# Patient Record
Sex: Male | Born: 1950 | Hispanic: No | Marital: Married | Smoking: Never smoker
Health system: Southern US, Community
[De-identification: ages and names within clinical notes are randomized; demographics above are authoritative.]

## PROBLEM LIST (undated history)

## (undated) DIAGNOSIS — I10 Essential (primary) hypertension: Secondary | ICD-10-CM

## (undated) DIAGNOSIS — I2510 Atherosclerotic heart disease of native coronary artery without angina pectoris: Secondary | ICD-10-CM

## (undated) DIAGNOSIS — M549 Dorsalgia, unspecified: Secondary | ICD-10-CM

## (undated) DIAGNOSIS — F32A Depression, unspecified: Secondary | ICD-10-CM

## (undated) DIAGNOSIS — I219 Acute myocardial infarction, unspecified: Secondary | ICD-10-CM

## (undated) DIAGNOSIS — I639 Cerebral infarction, unspecified: Secondary | ICD-10-CM

## (undated) DIAGNOSIS — E119 Type 2 diabetes mellitus without complications: Secondary | ICD-10-CM

## (undated) DIAGNOSIS — F329 Major depressive disorder, single episode, unspecified: Secondary | ICD-10-CM

## (undated) HISTORY — DX: Cerebral infarction, unspecified: I63.9

## (undated) HISTORY — DX: Type 2 diabetes mellitus without complications: E11.9

---

## 2004-03-13 HISTORY — PX: CORONARY ARTERY BYPASS GRAFT: SHX141

## 2011-04-03 ENCOUNTER — Encounter (HOSPITAL_COMMUNITY): Payer: Self-pay | Admitting: *Deleted

## 2011-04-03 ENCOUNTER — Inpatient Hospital Stay (HOSPITAL_COMMUNITY)
Admission: EM | Admit: 2011-04-03 | Discharge: 2011-04-06 | DRG: 014 | Disposition: A | Discharge status: Rehabilitation Facility | Payer: BC Managed Care – PPO | Attending: Internal Medicine | Admitting: Internal Medicine

## 2011-04-03 ENCOUNTER — Emergency Department (HOSPITAL_COMMUNITY): Payer: BC Managed Care – PPO

## 2011-04-03 ENCOUNTER — Other Ambulatory Visit: Payer: Self-pay

## 2011-04-03 DIAGNOSIS — R4701 Aphasia: Secondary | ICD-10-CM | POA: Diagnosis present

## 2011-04-03 DIAGNOSIS — I6350 Cerebral infarction due to unspecified occlusion or stenosis of unspecified cerebral artery: Principal | ICD-10-CM | POA: Diagnosis present

## 2011-04-03 DIAGNOSIS — F3289 Other specified depressive episodes: Secondary | ICD-10-CM | POA: Diagnosis present

## 2011-04-03 DIAGNOSIS — I252 Old myocardial infarction: Secondary | ICD-10-CM

## 2011-04-03 DIAGNOSIS — R1310 Dysphagia, unspecified: Secondary | ICD-10-CM | POA: Diagnosis present

## 2011-04-03 DIAGNOSIS — E785 Hyperlipidemia, unspecified: Secondary | ICD-10-CM

## 2011-04-03 DIAGNOSIS — I639 Cerebral infarction, unspecified: Secondary | ICD-10-CM | POA: Diagnosis present

## 2011-04-03 DIAGNOSIS — Z951 Presence of aortocoronary bypass graft: Secondary | ICD-10-CM

## 2011-04-03 DIAGNOSIS — G8190 Hemiplegia, unspecified affecting unspecified side: Secondary | ICD-10-CM | POA: Diagnosis present

## 2011-04-03 DIAGNOSIS — F329 Major depressive disorder, single episode, unspecified: Secondary | ICD-10-CM | POA: Diagnosis not present

## 2011-04-03 DIAGNOSIS — I2510 Atherosclerotic heart disease of native coronary artery without angina pectoris: Secondary | ICD-10-CM | POA: Diagnosis present

## 2011-04-03 DIAGNOSIS — F32A Depression, unspecified: Secondary | ICD-10-CM | POA: Diagnosis not present

## 2011-04-03 DIAGNOSIS — I10 Essential (primary) hypertension: Secondary | ICD-10-CM | POA: Diagnosis present

## 2011-04-03 HISTORY — DX: Dorsalgia, unspecified: M54.9

## 2011-04-03 HISTORY — DX: Major depressive disorder, single episode, unspecified: F32.9

## 2011-04-03 HISTORY — DX: Essential (primary) hypertension: I10

## 2011-04-03 HISTORY — DX: Acute myocardial infarction, unspecified: I21.9

## 2011-04-03 HISTORY — DX: Atherosclerotic heart disease of native coronary artery without angina pectoris: I25.10

## 2011-04-03 HISTORY — DX: Depression, unspecified: F32.A

## 2011-04-03 LAB — CBC
HCT: 43.4 % (ref 39.0–52.0)
Hemoglobin: 14.3 g/dL (ref 13.0–17.0)
MCH: 19.6 pg — ABNORMAL LOW (ref 26.0–34.0)
MCHC: 32.9 g/dL (ref 30.0–36.0)
MCV: 59.5 fL — ABNORMAL LOW (ref 78.0–100.0)

## 2011-04-03 LAB — COMPREHENSIVE METABOLIC PANEL
Albumin: 3.9 g/dL (ref 3.5–5.2)
BUN: 16 mg/dL (ref 6–23)
Chloride: 101 mEq/L (ref 96–112)
Creatinine, Ser: 0.92 mg/dL (ref 0.50–1.35)
GFR calc Af Amer: >90 mL/min (ref >90)
Glucose, Bld: 167 mg/dL — ABNORMAL HIGH (ref 70–99)
Total Bilirubin: 0.6 mg/dL (ref 0.3–1.2)
Total Protein: 8.2 g/dL (ref 6.0–8.3)

## 2011-04-03 LAB — DIFFERENTIAL
Basophils Relative: 0 % (ref 0–1)
Eosinophils Absolute: 0 10*3/uL (ref 0.0–0.7)
Monocytes Absolute: 0.7 10*3/uL (ref 0.1–1.0)
Monocytes Relative: 8 % (ref 3–12)

## 2011-04-03 LAB — CK TOTAL AND CKMB (NOT AT ARMC)
CK, MB: 1.7 ng/mL (ref 0.3–4.0)
Total CK: 324 U/L — ABNORMAL HIGH (ref 7–232)

## 2011-04-03 LAB — RAPID URINE DRUG SCREEN, HOSP PERFORMED
Opiates: NOT DETECTED
Tetrahydrocannabinol: NOT DETECTED

## 2011-04-03 MED ORDER — SODIUM CHLORIDE 0.9 % IJ SOLN: 3.0000 mL | INTRAMUSCULAR | Status: DC | PRN

## 2011-04-03 MED ORDER — ASPIRIN 325 MG PO TABS
325.0000 mg | ORAL_TABLET | Freq: Every day | ORAL | Status: DC
  Administered 2011-04-04 – 2011-04-06 (×3): 325 mg via ORAL
  Filled 2011-04-03 (×3): qty 1

## 2011-04-03 MED ORDER — ENOXAPARIN SODIUM 40 MG/0.4ML ~~LOC~~ SOLN
40.0000 mg | SUBCUTANEOUS | Status: DC
  Administered 2011-04-04 – 2011-04-06 (×3): 40 mg via SUBCUTANEOUS
  Filled 2011-04-03 (×3): qty 0.4

## 2011-04-03 MED ORDER — SODIUM CHLORIDE 0.9 % IV SOLN: 250.0000 mL | INTRAVENOUS | Status: DC | PRN

## 2011-04-03 MED ORDER — SODIUM CHLORIDE 0.9 % IJ SOLN
3.0000 mL | Freq: Two times a day (BID) | INTRAMUSCULAR | Status: DC
  Administered 2011-04-04 – 2011-04-06 (×2): 3 mL via INTRAVENOUS

## 2011-04-03 MED ORDER — INFLUENZA VIRUS VACC SPLIT PF IM SUSP
0.5000 mL | INTRAMUSCULAR | Status: AC
  Administered 2011-04-04: 0.5 mL via INTRAMUSCULAR
  Filled 2011-04-03: qty 0.5

## 2011-04-03 MED ORDER — ROSUVASTATIN CALCIUM 40 MG PO TABS
40.0000 mg | ORAL_TABLET | Freq: Every day | ORAL | Status: DC
  Administered 2011-04-04 – 2011-04-05 (×2): 40 mg via ORAL
  Filled 2011-04-03 (×3): qty 1

## 2011-04-03 MED ORDER — ENOXAPARIN SODIUM 40 MG/0.4ML ~~LOC~~ SOLN: 40.0000 mg | SUBCUTANEOUS | Status: DC

## 2011-04-03 MED ORDER — SENNOSIDES-DOCUSATE SODIUM 8.6-50 MG PO TABS: 1.0000 | ORAL_TABLET | Freq: Every evening | ORAL | Status: DC | PRN

## 2011-04-03 MED ORDER — ASPIRIN 300 MG RE SUPP
300.0000 mg | Freq: Every day | RECTAL | Status: DC
  Filled 2011-04-03: qty 1

## 2011-04-03 MED ORDER — SODIUM CHLORIDE 0.9 % IJ SOLN
3.0000 mL | Freq: Two times a day (BID) | INTRAMUSCULAR | Status: DC
  Administered 2011-04-03 – 2011-04-06 (×5): 3 mL via INTRAVENOUS

## 2011-04-03 MED ORDER — CLOPIDOGREL BISULFATE 300 MG PO TABS
300.0000 mg | ORAL_TABLET | Freq: Once | ORAL | Status: AC
  Administered 2011-04-03: 300 mg via ORAL

## 2011-04-03 MED ORDER — SODIUM CHLORIDE 0.9 % IV SOLN
INTRAVENOUS | Status: DC
  Administered 2011-04-03 – 2011-04-05 (×3): via INTRAVENOUS

## 2011-04-03 NOTE — ED Notes (Signed)
Pt. Arrived in CT scan,

## 2011-04-03 NOTE — ED Notes (Signed)
Pt.  patient arrived  From home via EMS with c/o right arm weakness, pt. Alert and oriented, language barrier noted, pt. speeks Ti with limited broken English, EMS unclear as to last known normal time

## 2011-04-03 NOTE — ED Provider Notes (Signed)
History     CSN: [id number]  Arrival date & time 04/03/11  1927   First MD Initiated Contact with Patient 04/03/11 1930      Chief Complaint  Patient presents with  . Code Stroke    (Consider location/radiation/quality/duration/timing/severity/associated sxs/prior treatment) Patient is a 61 y.o. male presenting with neurologic complaint. The history is provided by the patient. The history is limited by a language barrier. A language interpreter was used.  Neurologic Problem The primary symptoms include focal weakness and speech change. Primary symptoms do not include fever, nausea or vomiting. Episode onset: 5am, 14.5 hrs PTA. The neurological symptoms are focal. The symptoms occurred while sleeping.  There is decreased muscle function with maximum physical effort.  Region/motion of weakness: Right arm, right leg. There is impairment of the following actions: articulating words and standing up.  Features of the speech change include inability to articulate.    No past medical history on file.  No past surgical history on file.  No family history on file.  History  Substance Use Topics  . Smoking status: Not on file  . Smokeless tobacco: Not on file  . Alcohol Use: Not on file      Review of Systems  Constitutional: Negative for fever.  HENT: Negative for trouble swallowing.   Respiratory: Negative for cough and shortness of breath.   Cardiovascular: Negative for chest pain.  Gastrointestinal: Negative for nausea, vomiting, abdominal pain and diarrhea.  Genitourinary: Negative for difficulty urinating.  Neurological: Positive for speech change and focal weakness.  All other systems reviewed and are negative.    Allergies  Review of patient's allergies indicates not on file.  Home Medications  No current outpatient prescriptions on file.  There were no vitals taken for this visit.  Physical Exam  Nursing note and vitals reviewed. Constitutional: He is  oriented to person, place, and time. He appears well-developed and well-nourished. No distress.  HENT:  Head: Normocephalic and atraumatic.  Mouth/Throat: Oropharynx is clear and moist.  Eyes: Conjunctivae are normal. Pupils are equal, round, and reactive to light. No scleral icterus.  Neck: Normal range of motion. Neck supple.  Cardiovascular: Normal rate, regular rhythm, normal heart sounds and intact distal pulses.   No murmur heard. Pulmonary/Chest: Effort normal and breath sounds normal. No stridor. No respiratory distress. He has no wheezes. He has no rales.  Abdominal: Soft. He exhibits no distension. There is no tenderness.  Musculoskeletal: Normal range of motion. He exhibits no edema.  Neurological: He is alert and oriented to person, place, and time. A sensory deficit (right upper extremity) is present. No cranial nerve deficit. Coordination (Unable to perform on RUE and RLE) abnormal.       3/5 strength in RUE and RLE.    Skin: Skin is warm and dry. No rash noted.  Psychiatric: He has a normal mood and affect. His behavior is normal.    ED Course  Procedures (including critical care time)  Labs Reviewed  APTT - Abnormal; Notable for the following:    aPTT 38 (*)    All other components within normal limits  CBC - Abnormal; Notable for the following:    RBC 7.30 (*)    MCV 59.5 (*)    MCH 19.6 (*)    RDW 19.2 (*)    All other components within normal limits  DIFFERENTIAL - Abnormal; Notable for the following:    Neutrophils Relative 78 (*)    All other components within normal limits  COMPREHENSIVE METABOLIC PANEL - Abnormal; Notable for the following:    Glucose, Bld 167 (*)    GFR calc non Af Amer 90 (*)    All other components within normal limits  CK TOTAL AND CKMB - Abnormal; Notable for the following:    Total CK 324 (*)    All other components within normal limits  PROTIME-INR  TROPONIN I  URINE RAPID DRUG SCREEN (HOSP PERFORMED)  TSH  CARDIAC PANEL(CRET  KIN+CKTOT+MB+TROPI)  CARDIAC PANEL(CRET KIN+CKTOT+MB+TROPI)  HEMOGLOBIN A1C  HEMOGLOBIN A1C  LIPID PANEL  CBC  CREATININE, SERUM  CARDIAC PANEL(CRET KIN+CKTOT+MB+TROPI)   Ct Head Wo Contrast  04/03/2011  *RADIOLOGY REPORT*  Clinical Data: Right side weakness.  CT HEAD WITHOUT CONTRAST  Technique:  Contiguous axial images were obtained from the base of the skull through the vertex without contrast.  Comparison: None.  Findings: There is no evidence of acute intracranial abnormality including acute infarction, hemorrhage, mass lesion, mass effect, midline shift or abnormal excessive fluid collection.  No hydrocephalus or pneumocephalus.  Mild chronic microvascular ischemic change noted.  Calvarium intact.  IMPRESSION: No acute finding.  Original Report Authenticated By: Bernadene Bell. D'ALESSIO, M.D.     1. CVA (cerebral vascular accident)       MDM  61 yo male with onset of right sided weakness and mild aphasia 14 hours prior to arrival.  History obtained using interpretor phone.  In bed all day, unable to get up.  Presented as code stroke.  However, this was cancelled after he was determined to be outside of a therapeutic window.  On arrival, ABCs intact.  AFVSS. Had moderate RUE and RLE weakness.  Also appeared to have mild difficulty expressing words, although this exam was limited secondary to language barrier.  CT showed NAICA.  Labs unremarkable.  Neurology recommended Plavix load and hospitalist admission for further workup.  Admitted to hospitalist.          Warnell Forester, MD 04/04/11 [phone]

## 2011-04-03 NOTE — ED Notes (Signed)
Dr. Freida Busman at the bedside, pt. Cleared for ct scan

## 2011-04-03 NOTE — ED Notes (Signed)
PER PT via translator phone:  Pt went to bed last night at 10pm.  Was unable to get out of bed all morning due to weakness.  States that at 5pm, he rolled off the bed and was able to get up with family.  EMS was called by family.  Code stroke called by EMS at 1905, encoded at 46.  LSN per EMS was 1700.  Pt arrived at [facility], EDP exam at 1920.  Neruologist at bedside at 1941, lab at bedside at 1941.  Pt arrived in CT at 1922.  Pt denies pain.  Pt is unable to touch (R) finger to nose, pt also unable to resist gravity with (R) leg.  (L) side is normal, pt denies difficulty with swallowing.  No distress noted, pt resting.  Family at bedside at this time.

## 2011-04-03 NOTE — H&P (Addendum)
PCP:  Nonnie Done., MD, MD   DOA:  04/03/2011  7:27 PM  Chief Complaint:  Acute right sided weakness since this morning   History mainly provided by patient's wife.  61 y/o [place] male with hx of CAD s/p CABG at [facility] in 06, HTN, HL, depression with  not on any medications was brought in by EMS after he was unable to move his right side of the body. Patient was in his usual state of health. Code stroke was called in the ED and since he had crossed the therapeutic window no tPA was given. Patient deneis any chest pain palpitations, SOB, headache, blurring of vision, cough, nausea or vomiting. Denies tinnitus or peripheral numbness. Denies speech or swallowing difficulty. Patient quite restless in bed being unable to move his right side. As per wife patient has not seen a physical in few years as he does not with o see any. Also does not take any mediations and also did not wish to come to the hospital today. He was quite depressed about 4 years back and jumped out of the window of his second story house and broke his ankle and injured his back. Wife feels he is still depressed but denies being suicidal or further attempting such acts after that incident.  Patient was seen by neurology in ED and given a loading dose of plavix and planned on admitting to [facility] service.  Allergies: No Known Allergies  Prior to Admission medications   Not on File    Past Medical History  Diagnosis Date  . Hypertension   . Acute MI   . Coronary artery disease   . Depression   . Back pain     Past Surgical History  Procedure Date  . Coronary artery bypass graft 2006    Social History:  does not have a smoking history on file. He does not have any smokeless tobacco history on file. His alcohol and drug histories not on file.  History reviewed. No pertinent family history.  Review of Systems:  Constitutional: Denies fever, chills, diaphoresis, appetite change and fatigue.  HEENT: Denies  photophobia, eye pain, redness, hearing loss, ear pain, congestion, sore throat, rhinorrhea, sneezing, mouth sores, trouble swallowing, neck pain, neck stiffness and tinnitus.   Respiratory: Denies SOB, DOE, cough, chest tightness,  and wheezing.   Cardiovascular: Denies chest pain, palpitations and leg swelling.  Gastrointestinal: Denies nausea, vomiting, abdominal pain, diarrhea, constipation, blood in stool and abdominal distention.  Genitourinary: Denies dysuria, urgency, frequency, hematuria, flank pain and difficulty urinating.  Musculoskeletal: Denies myalgias, back pain, joint swelling, arthralgias and gait problem.  Skin: Denies pallor, rash and wound.  Neurological: rt Upper and lower extremity weakness, Denies dizziness, seizures, syncope,light-headedness, numbness and headaches.  Hematological: Denies adenopathy. Easy bruising, personal or family bleeding history  Psychiatric/Behavioral: Denies suicidal ideation, mood changes, confusion, nervousness, sleep disturbance and agitation   Physical Exam:  Filed Vitals:   04/03/11 1950 04/03/11 2000 04/03/11 2005 04/03/11 2019  BP: 181/90 154/90 154/90   Pulse: 105 96 91   Temp:  98.4 F (36.9 C)  98.4 F (36.9 C)  TempSrc:  Oral    Resp: 25 22 18    SpO2: 96% 95% 95%     Constitutional: Vital signs reviewed.  Patient is a well-developed and well-nourished in no acute distress and cooperative with exam.  HEENT: no pallor, moist oral mucosa, no JVD,  Cardiovascular: RRR, S1 normal, S2 normal, no MRG, pulses symmetric and intact bilaterally Pulmonary/Chest:  CTAB, no wheezes, rales, or rhonchi Abdominal: Soft. Non-tender, non-distended, bowel sounds are normal, no masses, organomegaly, or guarding present.  GU: no CVA tenderness Musculoskeletal: No joint deformities, erythema, or stiffness, ROM full and no nontender Ext: no edema and no cyanosis, pulses palpable bilaterally (DP and PT) Hematology: no cervical, inginal, or axillary  adenopathy.  Neurological: A&O x3, left facial deviation, normal tone, 3+/5 power in both rt upper and lowe extremities, reflexes 2+ b/l, plantar downgoing b/l ,normal sensations b/l , cerebellar fn intact ( limited evaluation due to rt sided weakness). Gait not assessed. Skin: Warm, dry and intact. No rash, cyanosis, or clubbing.  Psychiatric: Normal mood and affect. speech and behavior is normal. Judgment and thought content normal. Cognition and memory are normal.   Labs on Admission:  Results for orders placed during the hospital encounter of 04/03/11 (from the past 48 hour(s))  PROTIME-INR     Status: Normal   Collection Time   04/03/11  7:52 PM      Component Value Range Comment   Prothrombin Time 13.1  11.6 - 15.2 (seconds)    INR 0.97  0.00 - 1.49    APTT     Status: Abnormal   Collection Time   04/03/11  7:52 PM      Component Value Range Comment   aPTT 38 (*) 24 - 37 (seconds)   CBC     Status: Abnormal   Collection Time   04/03/11  7:52 PM      Component Value Range Comment   WBC 9.4  4.0 - 10.5 (K/uL)    RBC 7.30 (*) 4.22 - 5.81 (MIL/uL)    Hemoglobin 14.3  13.0 - 17.0 (g/dL)    HCT 44.0  10.2 - 72.5 (%)    MCV 59.5 (*) 78.0 - 100.0 (fL)    MCH 19.6 (*) 26.0 - 34.0 (pg)    MCHC 32.9  30.0 - 36.0 (g/dL)    RDW 36.6 (*) 44.0 - 15.5 (%)    Platelets 248  150 - 400 (K/uL)   DIFFERENTIAL     Status: Abnormal   Collection Time   04/03/11  7:52 PM      Component Value Range Comment   Neutrophils Relative 78 (*) 43 - 77 (%)    Neutro Abs 7.3  1.7 - 7.7 (K/uL)    Lymphocytes Relative 14  12 - 46 (%)    Lymphs Abs 1.3  0.7 - 4.0 (K/uL)    Monocytes Relative 8  3 - 12 (%)    Monocytes Absolute 0.7  0.1 - 1.0 (K/uL)    Eosinophils Relative 0  0 - 5 (%)    Eosinophils Absolute 0.0  0.0 - 0.7 (K/uL)    Basophils Relative 0  0 - 1 (%)    Basophils Absolute 0.0  0.0 - 0.1 (K/uL)   COMPREHENSIVE METABOLIC PANEL     Status: Abnormal   Collection Time   04/03/11  7:52 PM       Component Value Range Comment   Sodium 138  135 - 145 (mEq/L)    Potassium 3.6  3.5 - 5.1 (mEq/L)    Chloride 101  96 - 112 (mEq/L)    CO2 23  19 - 32 (mEq/L)    Glucose, Bld 167 (*) 70 - 99 (mg/dL)    BUN 16  6 - 23 (mg/dL)    Creatinine, Ser 3.47  0.50 - 1.35 (mg/dL)    Calcium 9.2  8.4 - 10.5 (  mg/dL)    Total Protein 8.2  6.0 - 8.3 (g/dL)    Albumin 3.9  3.5 - 5.2 (g/dL)    AST 22  0 - 37 (U/L)    ALT 17  0 - 53 (U/L)    Alkaline Phosphatase 83  39 - 117 (U/L)    Total Bilirubin 0.6  0.3 - 1.2 (mg/dL)    GFR calc non Af Amer 90 (*) >90 (mL/min)    GFR calc Af Amer >90  >90 (mL/min)   CK TOTAL AND CKMB     Status: Abnormal   Collection Time   04/03/11  7:52 PM      Component Value Range Comment   Total CK 324 (*) 7 - 232 (U/L)    CK, MB 1.7  0.3 - 4.0 (ng/mL)    Relative Index 0.5  0.0 - 2.5    TROPONIN I     Status: Normal   Collection Time   04/03/11  7:52 PM      Component Value Range Comment   Troponin I <0.30  <0.30 (ng/mL)     Radiological Exams on Admission: CT HEAD WITHOUT CONTRAST  Technique: Contiguous axial images were obtained from the base of  the skull through the vertex without contrast.  Comparison: None.  Findings: There is no evidence of acute intracranial abnormality  including acute infarction, hemorrhage, mass lesion, mass effect,  midline shift or abnormal excessive fluid collection. No  hydrocephalus or pneumocephalus. Mild chronic microvascular  ischemic change noted. Calvarium intact.   IMPRESSION:  No acute finding.   EKG: NSR , no ST-T changes  Assessment 61 y/o [place] male with hx of CAD s/p CABG at [facility] in 06, HTN, HL, depression with not on any medications was brought in by EMS after he was unable to move his right side of the body consistent with rt sided CVA.   Plan   *CVA (cerebrovascular accident) Admit to tele neurochecks  gvien loading dose of plavix in the ED  check serial CE  And EKG Check Hb A1C, TSH  and  lipid panel Allow prrmissive HTN  check 2d echo, carotid doppler, MRI and MRA brain Pt eval, Swallow eval Will follow up with neurology recs     CAD (coronary artery disease) s/p CABG  hx of medication non compliance   Hypertension Stable   allow permissive HTN    Depression Stable   Hyperlipidemia Started on crestor Monitor lipid panel in am  DVT prophylaxis:  sq lovenox  Diet: NPO until cleared for swallow  Full code   Plan discussed with patient and his wife at bedside     Time Spent on Admission: 45 minutes  Jeraldean Wechter 04/03/2011, 8:57 PM

## 2011-04-03 NOTE — ED Notes (Signed)
Pt. Arrived via EMS, Code Stroke called at [employer]

## 2011-04-03 NOTE — Consult Note (Signed)
Reason for Consult:  Stroke code  Referring Physician: Dhungel, N MD  CC: right hemiparesis  HPI: Edward Choi is an 61 y.o. male presenting to the ER with right hemiparesis. The patient does not speak fluent English so the history and physical were conducted with the use of [place] translation via Radio producer. The patient apparently woke up this morning around 5am with right sided weakness.  His wife had already left for work so he lay in bed all day with continued weakness on the right.  Around 5pm, his family was there and he fell out of bed trying to get out.  The family sat him in a chair and then called EMS.  The patient is also having trouble with his speech.  He denies any numbness or tingling. He denies any problems with swallowing or vision.  The patient has a slight headache but denies and dizzyness/vertigo/lightheadedness. He did not have nausea nor did he vomit.  The patient has never had this happen before. He underwent a CABG in 2007 (?) and tells me he has high blood pressure and cholesterol but is not taking any medications at all.    Past Medical History  Diagnosis Date  . Hypertension   . Acute MI   . Coronary artery disease   . Depression   . Back pain     Past Surgical History  Procedure Date  . Coronary artery bypass graft 2006    History reviewed. No pertinent family history.  Social History:  does not have a smoking history on file. He does not have any smokeless tobacco history on file. His alcohol and drug histories not on file.  No Known Allergies  Medications: no home medications  ROS: Pt denies any blood in his bowel movements or urine. He denies fever/chills/SOB/cough/muscle aches. He does not have any chest pain or palpitations.  He does not have diarrhea or pain on urination.  Physical Examination: Blood pressure 143/79, pulse 86, temperature 98.4 F (36.9 C), temperature source Oral, resp. rate 24, SpO2 100.00%. General: pt alert and  oriented, NAD, Limited exam due to language barrier and done via phone translator.   CV: S1,S2, no S3/S4/murmur/carotid bruits Pulm: clear to ausculation BL Abd: soft Neurologic Examination Naming/comprehension not grossly impaired, speech sounds fluent but dysarthric, PERRL, no VF, EOMI, no Fdroop, V1-V3 decreased to ST/PP, hearing grossly intact, pharynx arch slight asym, SCM/trap 5/5, tong slightly deviated to R Motor:  R: UE- dist 2/5, prox 3/5 LE: foot drop, prox 4+/5, L U and LE's 5/5, flaccid tone R UE Sensory: intact to ST/PP Coordination: FNF and HKS WNL on L, dysmetria on R Reflexes: symmetric   Results for orders placed during the hospital encounter of 04/03/11 (from the past 48 hour(s))  PROTIME-INR     Status: Normal   Collection Time   04/03/11  7:52 PM      Component Value Range Comment   Prothrombin Time 13.1  11.6 - 15.2 (seconds)    INR 0.97  0.00 - 1.49    APTT     Status: Abnormal   Collection Time   04/03/11  7:52 PM      Component Value Range Comment   aPTT 38 (*) 24 - 37 (seconds)   CBC     Status: Abnormal   Collection Time   04/03/11  7:52 PM      Component Value Range Comment   WBC 9.4  4.0 - 10.5 (K/uL)    RBC 7.30 (*) 4.22 -  5.81 (MIL/uL)    Hemoglobin 14.3  13.0 - 17.0 (g/dL)    HCT 16.1  09.6 - 04.5 (%)    MCV 59.5 (*) 78.0 - 100.0 (fL)    MCH 19.6 (*) 26.0 - 34.0 (pg)    MCHC 32.9  30.0 - 36.0 (g/dL)    RDW 40.9 (*) 81.1 - 15.5 (%)    Platelets 248  150 - 400 (K/uL)   DIFFERENTIAL     Status: Abnormal   Collection Time   04/03/11  7:52 PM      Component Value Range Comment   Neutrophils Relative 78 (*) 43 - 77 (%)    Neutro Abs 7.3  1.7 - 7.7 (K/uL)    Lymphocytes Relative 14  12 - 46 (%)    Lymphs Abs 1.3  0.7 - 4.0 (K/uL)    Monocytes Relative 8  3 - 12 (%)    Monocytes Absolute 0.7  0.1 - 1.0 (K/uL)    Eosinophils Relative 0  0 - 5 (%)    Eosinophils Absolute 0.0  0.0 - 0.7 (K/uL)    Basophils Relative 0  0 - 1 (%)    Basophils Absolute  0.0  0.0 - 0.1 (K/uL)   COMPREHENSIVE METABOLIC PANEL     Status: Abnormal   Collection Time   04/03/11  7:52 PM      Component Value Range Comment   Sodium 138  135 - 145 (mEq/L)    Potassium 3.6  3.5 - 5.1 (mEq/L)    Chloride 101  96 - 112 (mEq/L)    CO2 23  19 - 32 (mEq/L)    Glucose, Bld 167 (*) 70 - 99 (mg/dL)    BUN 16  6 - 23 (mg/dL)    Creatinine, Ser 9.14  0.50 - 1.35 (mg/dL)    Calcium 9.2  8.4 - 10.5 (mg/dL)    Total Protein 8.2  6.0 - 8.3 (g/dL)    Albumin 3.9  3.5 - 5.2 (g/dL)    AST 22  0 - 37 (U/L)    ALT 17  0 - 53 (U/L)    Alkaline Phosphatase 83  39 - 117 (U/L)    Total Bilirubin 0.6  0.3 - 1.2 (mg/dL)    GFR calc non Af Amer 90 (*) >90 (mL/min)    GFR calc Af Amer >90  >90 (mL/min)   CK TOTAL AND CKMB     Status: Abnormal   Collection Time   04/03/11  7:52 PM      Component Value Range Comment   Total CK 324 (*) 7 - 232 (U/L)    CK, MB 1.7  0.3 - 4.0 (ng/mL)    Relative Index 0.5  0.0 - 2.5    TROPONIN I     Status: Normal   Collection Time   04/03/11  7:52 PM      Component Value Range Comment   Troponin I <0.30  <0.30 (ng/mL)     No results found for this or any previous visit (from the past 240 hour(s)).  Ct Head Wo Contrast  04/03/2011  *RADIOLOGY REPORT*  Clinical Data: Right side weakness.  CT HEAD WITHOUT CONTRAST  Technique:  Contiguous axial images were obtained from the base of the skull through the vertex without contrast.  Comparison: None.  Findings: There is no evidence of acute intracranial abnormality including acute infarction, hemorrhage, mass lesion, mass effect, midline shift or abnormal excessive fluid collection.  No hydrocephalus or pneumocephalus.  Mild  chronic microvascular ischemic change noted.  Calvarium intact.  IMPRESSION: No acute finding.  Original Report Authenticated By: Bernadene Bell. D'ALESSIO, M.D.   Dg Chest Portable 1 View  04/03/2011  *RADIOLOGY REPORT*  Clinical Data: Acute right-sided weakness.  PORTABLE CHEST - 1 VIEW   Comparison: None.  Findings: Heart size and pulmonary vascularity are normal and the lungs are clear.  Markings are slightly accentuated due to a shallow inspiration.  Prior CABG.  IMPRESSION: No acute abnormalities.  Original Report Authenticated By: Gwynn Burly, M.D.     Assessment/Plan:  61 yo [place] man PMH MI s/p CABG, HTN, HLD presenting in the ER with right hemiparesis and dysarthria, not on any medications. PE is localizing. CThead neg for hemorrhage. The patient's last time seen normal is >14 hours.  He is not in the window for tpa or any interventional procedures.   1. Load with plavix 300mg  in the hope of preventing worsening. 2. CVA workup 3. Stroke code called off.    Arita Miss, MD [facility] 04/03/2011, 9:18 PM

## 2011-04-04 ENCOUNTER — Inpatient Hospital Stay (HOSPITAL_COMMUNITY): Payer: BC Managed Care – PPO

## 2011-04-04 DIAGNOSIS — I6330 Cerebral infarction due to thrombosis of unspecified cerebral artery: Secondary | ICD-10-CM

## 2011-04-04 LAB — CBC
Platelets: 220 10*3/uL (ref 150–400)
RBC: 6.49 MIL/uL — ABNORMAL HIGH (ref 4.22–5.81)
RDW: 18.2 % — ABNORMAL HIGH (ref 11.5–15.5)
WBC: 9 10*3/uL (ref 4.0–10.5)

## 2011-04-04 LAB — CARDIAC PANEL(CRET KIN+CKTOT+MB+TROPI)
CK, MB: 1.9 ng/mL (ref 0.3–4.0)
Relative Index: 0.5 (ref 0.0–2.5)
Relative Index: 0.4 (ref 0.0–2.5)
Total CK: 357 U/L — ABNORMAL HIGH (ref 7–232)
Total CK: 512 U/L — ABNORMAL HIGH (ref 7–232)
Total CK: 447 U/L — ABNORMAL HIGH (ref 7–232)

## 2011-04-04 LAB — LIPID PANEL
HDL: 37 mg/dL — ABNORMAL LOW (ref >39)
Total CHOL/HDL Ratio: 6.3 RATIO
VLDL: 22 mg/dL (ref 0–40)

## 2011-04-04 LAB — HEMOGLOBIN A1C: Hgb A1c MFr Bld: 5.6 % (ref <5.7)

## 2011-04-04 LAB — CREATININE, SERUM
Creatinine, Ser: 0.87 mg/dL (ref 0.50–1.35)
GFR calc Af Amer: >90 mL/min (ref >90)

## 2011-04-04 LAB — TSH: TSH: 1.605 u[IU]/mL (ref 0.350–4.500)

## 2011-04-04 NOTE — Progress Notes (Signed)
Carotid Doppler has been performed. Bilaterally no significant ICA stenosis with antegrade vertebral flow.  Farrel Demark, RDMS

## 2011-04-04 NOTE — Progress Notes (Signed)
  Echocardiogram 2D Echocardiogram has been performed.  Juanita Laster Kainon Varady 04/04/2011, 2:28 PM

## 2011-04-04 NOTE — Progress Notes (Signed)
Subjective: Chart reviewed. Patient indicates that he feels stronger and was able to work with physical therapy. He is still weak on the right side and has speech difficulties.  Objective: Blood pressure 170/104, pulse 89, temperature 98.7 F (37.1 C), temperature source Oral, resp. rate 18, height 5\' 2"  (1.575 m), weight 58.2 kg (128 lb 4.9 oz), SpO2 97.00%. No intake or output data in the 24 hours ending 04/04/11 1118  General Exam: Comfortable. Sitting up in a chair and in no obvious distress. Respiratory System: Clear. No increased work of breathing. Cardiovascular System: First and second heart sounds heard. Regular rate and rhythm. No JVD/murmurs. Telemetry shows sinus rhythm. Gastrointestinal System: Abdomen is non distended, soft and normal bowel sounds heard. Central Nervous System: Alert and oriented. Mild right lower facial weakness. Extremities: Grade 0 x 5 power all groups in the right upper extremity. Grade 3 x 5 right lower extremity proximally and 2 x 5 distally.  Lab Results: Basic Metabolic Panel:  Basename 04/03/11 1952 04/03/11  NA 138 --  K 3.6 --  CL 101 --  CO2 23 --  GLUCOSE 167* --  BUN 16 --  CREATININE 0.92 0.87  CALCIUM 9.2 --  MG -- --  PHOS -- --   Liver Function Tests:  [facility] 04/03/11 1952  AST 22  ALT 17  ALKPHOS 83  BILITOT 0.6  PROT 8.2  ALBUMIN 3.9   No results found for this basename: LIPASE:2,AMYLASE:2 in the last 72 hours No results found for this basename: AMMONIA:2 in the last 72 hours CBC:  Basename 04/03/11 1952 04/03/11  WBC 9.4 9.0  NEUTROABS 7.3 --  HGB 14.3 12.8*  HCT 43.4 38.1*  MCV 59.5* 58.7*  PLT 248 220   Cardiac Enzymes:  Basename 04/04/11 0823 04/03/11 1952 04/03/11  CKTOTAL 512* 324* 357*  CKMB 2.3 1.7 1.9  CKMBINDEX -- -- --  TROPONINI <0.30 <0.30 <0.30   BNP: No results found for this basename: PROBNP:3 in the last 72 hours D-Dimer: No results found for this basename: DDIMER:2 in the last 72  hours CBG: No results found for this basename: GLUCAP:6 in the last 72 hours Hemoglobin A1C:  Basename 04/03/11  HGBA1C 5.6   Fasting Lipid Panel:  Basename 04/04/11 0630  CHOL 232*  HDL 37*  LDLCALC 173*  TRIG 111  CHOLHDL 6.3  LDLDIRECT --   Thyroid Function Tests:  Basename 04/03/11  TSH 1.605  T4TOTAL --  FREET4 --  T3FREE --  THYROIDAB --   Anemia Panel: No results found for this basename: VITAMINB12,FOLATE,FERRITIN,TIBC,IRON,RETICCTPCT in the last 72 hours Coagulation:  Basename 04/03/11 1952  LABPROT 13.1  INR 0.97   Urine Drug Screen: Drugs of Abuse     Component Value Date/Time   LABOPIA NONE DETECTED 04/03/2011 2031   COCAINSCRNUR NONE DETECTED 04/03/2011 2031   LABBENZ NONE DETECTED 04/03/2011 2031   AMPHETMU NONE DETECTED 04/03/2011 2031   THCU NONE DETECTED 04/03/2011 2031   LABBARB NONE DETECTED 04/03/2011 2031    Alcohol Level: No results found for this basename: ETH:2 in the last 72 hours  Micro Results: No results found for this or any previous visit (from the past 240 hour(s)).  Studies/Results: Ct Head Wo Contrast  04/03/2011  *RADIOLOGY REPORT*  Clinical Data: Right side weakness.  CT HEAD WITHOUT CONTRAST  Technique:  Contiguous axial images were obtained from the base of the skull through the vertex without contrast.  Comparison: None.  Findings: There is no evidence of acute intracranial abnormality including  acute infarction, hemorrhage, mass lesion, mass effect, midline shift or abnormal excessive fluid collection.  No hydrocephalus or pneumocephalus.  Mild chronic microvascular ischemic change noted.  Calvarium intact.  IMPRESSION: No acute finding.  Original Report Authenticated By: Bernadene Bell. Maricela Curet, M.D.   Mr Maxine Glenn Head Wo Contrast  04/04/2011  *RADIOLOGY REPORT*  Clinical Data:  Right-sided weakness.  MRI HEAD WITHOUT CONTRAST MRA HEAD WITHOUT CONTRAST  Technique: Multiplanar, multiecho pulse sequences of the brain and surrounding  structures were obtained according to standard protocol without intravenous contrast.  Angiographic images of the head were obtained using MRA technique without contrast.  Comparison: 04/03/2011 CT.  MRI HEAD  Findings:  Left paracentral pontine acute non hemorrhagic infarct.  Moderate nonspecific white matter type changes probably related to result of small vessel disease given the present findings.  No intracranial hemorrhage.  Global atrophy without hydrocephalus.  No intracranial mass lesion detected on this unenhanced exam.  Partial opacification left mastoid air cells without obstructing lesion noted.  Mild mucosal thickening ethmoid sinus air cells.  Minimal anterior slip C2 with mild spinal stenosis.  IMPRESSION: Left paracentral pontine acute non hemorrhagic infarct.  Moderate nonspecific white matter type changes probably related to result of small vessel disease.  Global atrophy without hydrocephalus.  Partial opacification left mastoid air cells without obstructing lesion noted.  Mild mucosal thickening ethmoid sinus air cells.  MRA HEAD  Findings: Mild narrowing proximal A1 segment right anterior cerebral artery.  Mild narrowing right middle cerebral artery bifurcation and proximal right middle cerebral artery branch vessels.  Otherwise anterior circulation without medium or large size vessel significant stenosis or occlusion.  Codominant vertebral arteries appear ectatic with mild narrowing of the distal left vertebral artery and mild narrowing of the right vertebral artery at the takeoff of the right PICA.  Irregularity with mild narrowing mid and distal aspect of the basilar artery.  Nonvisualization right AICA with diminutive size of left AICA.  Mild irregularity superior cerebellar arteries bilaterally.  Posterior cerebral artery distal branch vessel irregularity.  No aneurysm noted.  IMPRESSION: Mild intracranial atherosclerotic type changes as noted above.  Original Report Authenticated By:  Fuller Canada, M.D.    Dg Chest Portable 1 View  04/03/2011  *RADIOLOGY REPORT*  Clinical Data: Acute right-sided weakness.  PORTABLE CHEST - 1 VIEW  Comparison: None.  Findings: Heart size and pulmonary vascularity are normal and the lungs are clear.  Markings are slightly accentuated due to a shallow inspiration.  Prior CABG.  IMPRESSION: No acute abnormalities.  Original Report Authenticated By: Gwynn Burly, M.D.   Carotid Doppler Bilaterally no significant ICA stenosis with antegrade vertebral flow    Medications: Scheduled Meds:    . aspirin  325 mg Oral Daily  . clopidogrel  300 mg Oral Once  . enoxaparin  40 mg Subcutaneous Q24H  . influenza  inactive virus vaccine  0.5 mL Intramuscular Tomorrow-1000  . rosuvastatin  40 mg Oral q1800  . sodium chloride  3 mL Intravenous Q12H  . sodium chloride  3 mL Intravenous Q12H  . DISCONTD: aspirin  300 mg Rectal Daily  . DISCONTD: enoxaparin  40 mg Subcutaneous Q24H   Continuous Infusions:    . sodium chloride 100 mL/hr at 04/04/11 0500   PRN Meds:.sodium chloride, sodium chloride, senna-docusate, sodium chloride, sodium chloride  Assessment/Plan: 1. 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 Continue aspirin 325 mg daily for secondary stroke prevention. Plavix stopped. Inpatient rehabilitation consulted and input  is pending. Neurology followup appreciated. Continue statins. 2-D echo is pending. 2. Hypertension: Allow permissive hypertension for a day or 2. May consider starting metoprolol. 3. Hyperlipidemia: Continue Crestor. 4. History of coronary artery disease status post CABG: Continue aspirin 5. Medication noncompliance: Compliance counseled 6. Microcytosis: ? secondary to hemoglobinopathy. Hemoglobin is normal.  Discussed with patient and his spouse at the bedside and updated care.    HONGALGI,ANAND 04/04/2011, 11:18 AM

## 2011-04-04 NOTE — ED Provider Notes (Signed)
I saw and evaluated the patient, reviewed the resident's note and I agree with the findings and plan.  Toy Baker, MD 04/04/11 [phone]

## 2011-04-04 NOTE — Progress Notes (Signed)
Speech Language Pathology Clinical/Bedside Swallow Evaluation Patient Details  Name: Edward Choi MRN: [id number] DOB: 1950-09-15 Today's Date: 04/04/2011  Past Medical History:  Past Medical History  Diagnosis Date  . Hypertension   . Acute MI   . Coronary artery disease   . Depression   . Back pain    Past Surgical History:  Past Surgical History  Procedure Date  . Coronary artery bypass graft 2006   HPI:  61 y/o [place] male with hx of CAD s/p CABG(2006), HTN, HL, depression.  Brought in by EMS after he was unable to move his right side of the body.  MRI revealed Left paracentral pontine acute non hemorrhagic infarct.   Assessment/Recommendations/Treatment Plan   SLP Assessment Clinical Impression Statement: One episode of coughing noted with straw use, likely due to larger bolus size.  No cough response elicited with thin liquid via cup sip.  Pt unable to use right (dominant) hand to cut, so will downgrade diet to soft with chopped meats, extra gravy/sauce to facilitate independence with self-feeding.  Precautions posted at [facility]. Risk for Aspiration: Mild  Swallow Evaluation Recommendations Solid Consistency: Dysphagia 3 (Mechanical soft) (chopped meats, extra gravy/sauce) Liquid Consistency: Thin Liquid Administration via: No straw;Cup Medication Administration: Whole meds with puree Supervision: Patient able to self feed;Intermittent supervision to cue for compensatory strategies Compensations: Slow rate Postural Changes and/or Swallow Maneuvers: Seated upright 90 degrees Oral Care Recommendations: Oral care BID Recommendations for Other Services: Rehab consult Follow up Recommendations: Inpatient Rehab  Treatment Plan Treatment Plan Recommendations: Therapy as outlined in treatment plan below Speech Therapy Frequency: min 2x/week Treatment Duration: 2 weeks Interventions: Aspiration precaution training;Patient/family education;Diet toleration management by  SLP;Trials of upgraded texture/liquids  Prognosis Prognosis for Safe Diet Advancement: Good  Individuals Consulted Consulted and Agree with Results and Recommendations: Patient;Family member/caregiver Family Member Consulted: wife  Swallowing Goals  SLP Swallowing Goals Patient will consume recommended diet without observed clinical signs of aspiration with: Minimal assistance Swallow Study Goal #1 - Progress: Progressing toward goal Patient will utilize recommended strategies during swallow to increase swallowing safety with: Minimal assistance Swallow Study Goal #2 - Progress: Progressing toward goal  Swallow Study Prior Functional Status  Type of Home: House Lives With: Spouse;Family Receives Help From: Family Vocation: Unemployed  General  Date of Onset: 04/03/11 HPI: 61 y/o [place] male with hx of CAD s/p CABG(2006), HTN, HL, depression.  Brought in by EMS after he was unable to move his right side of the body.  MRI revealed Left paracentral pontine acute non hemorrhagic infarct. Type of Study: Bedside swallow evaluation Diet Prior to this Study: Regular;Thin liquids Temperature Spikes Noted: No Respiratory Status: Room air History of Intubation: No Behavior/Cognition: Alert;Cooperative;Pleasant mood Oral Cavity - Dentition: Adequate natural dentition Vision: Functional for self-feeding Patient Positioning: Upright in bed Baseline Vocal Quality: Clear Volitional Cough: Strong Volitional Swallow: Able to elicit  Oral Motor/Sensory Function  Labial ROM: Reduced right Labial Symmetry: Abnormal symmetry right Labial Strength: Reduced Labial Sensation: Within Functional Limits Lingual ROM: Reduced right Lingual Symmetry: Abnormal symmetry right Lingual Strength: Reduced Lingual Sensation: Within Functional Limits Facial ROM: Reduced right Facial Symmetry: Right droop Facial Strength: Reduced Facial Sensation: Within Functional Limits Velum: Within Functional  Limits Mandible: Within Functional Limits  Consistency Results  Ice Chips Ice chips: Not tested  Thin Liquid Thin Liquid: Impaired Presentation: Cup;Self Fed;Straw Pharyngeal  Phase Impairments: Cough - Immediate;Other (comments) (with straw use - pt takes larger boluses via straw)  Nectar Thick Liquid Nectar Thick  Liquid: Not tested  Honey Thick Liquid Honey Thick Liquid: Not tested  Puree Puree: Within functional limits Presentation: Self Fed;Spoon  Solid Solid: Within functional limits Presentation: Self Fed;Spoon Celia B. Murvin Natal [facility], CCC-SLP [phone] pager

## 2011-04-04 NOTE — Evaluation (Signed)
Occupational Therapy Evaluation Patient Details Name: Edward Choi MRN: [id number] DOB: 1950-08-18 Today's Date: 04/04/2011  Problem List:  Patient Active Problem List  Diagnoses  . CAD (coronary artery disease)  . CVA (cerebrovascular accident)  . Hypertension  . Depression  . Hx of CABG  . Hyperlipidemia    Past Medical History:  Past Medical History  Diagnosis Date  . Hypertension   . Acute MI   . Coronary artery disease   . Depression   . Back pain    Past Surgical History:  Past Surgical History  Procedure Date  . Coronary artery bypass graft 2006    OT Assessment/Plan/Recommendation OT Assessment Clinical Impression Statement: 61 yo s/p Patient presents with dominant right side hemiplgia, decreased postural control, delayed balance and righting reactions, mild motor apraxia, blurry vision of new onset, decreasde sustained trunk and right LE muscle activation, and overall decreased activity tolerance resulting in increased risk of fall and decreased functional independence. Acute OT to follow patient at frequencing of 2x/week during acute hospital stay to address these impairments for maximum functional gain. Recommend CIR consult for further therapies in CIR setting prior to discharge home. Patient and wife in agreement with this plan.  OT Recommendation/Assessment: Patient will need skilled OT in the acute care venue OT Problem List: Decreased range of motion;Decreased strength;Decreased activity tolerance;Impaired balance (sitting and/or standing);Decreased safety awareness;Decreased knowledge of use of DME or AE;Decreased knowledge of precautions;Impaired sensation;Impaired UE functional use OT Therapy Diagnosis : Generalized weakness;Hemiplegia dominant side OT Plan OT Frequency: Min 2X/week OT Treatment/Interventions: Self-care/ADL training;Neuromuscular education;DME and/or AE instruction;Therapeutic activities;Patient/family education;Balance  training;Visual/perceptual remediation/compensation OT Recommendation Follow Up Recommendations: Inpatient Rehab Equipment Recommended: Defer to next venue Individuals Consulted Consulted and Agree with Results and Recommendations: Family member/caregiver;Patient OT Goals Acute Rehab OT Goals OT Goal Formulation: With patient/family Time For Goal Achievement: 2 weeks ADL Goals Pt Will Perform Grooming: with supervision;Standing at sink;Supported ADL Goal: Grooming - Progress: Goal set today Pt Will Perform Upper Body Bathing: with supervision;Sitting, chair ADL Goal: Upper Body Bathing - Progress: Goal set today Pt Will Transfer to Toilet: with min assist;Ambulation;3-in-1 ADL Goal: Toilet Transfer - Progress: Goal set today Pt Will Perform Toileting - Clothing Manipulation: with min assist;Sitting on 3-in-1 or toilet ADL Goal: Toileting - Clothing Manipulation - Progress: Goal set today Pt Will Perform Toileting - Hygiene: with supervision;Sit to stand from 3-in-1/toilet ADL Goal: Toileting - Hygiene - Progress: Goal set today Miscellaneous OT Goals Miscellaneous OT Goal #1: Pt will perform bed mobility Supervision level as precursor to ADLS OT Goal: Miscellaneous Goal #1 - Progress: Goal set today  OT Evaluation Precautions/Restrictions  Precautions Precautions: Fall Precaution Comments: blurry vision, right sided hemiplegia Required Braces or Orthoses: No Restrictions Weight Bearing Restrictions: No Prior Functioning Home Living Lives With: Spouse;Family Receives Help From: Family Type of Home: House Home Layout: One level Home Access: Stairs to enter Entrance Stairs-Rails: Right;Left;Can reach both Entrance Stairs-Number of Steps: 2-3 Bathroom Shower/Tub: Forensic scientist: Standard Bathroom Accessibility: Yes How Accessible: Accessible via walker Home Adaptive Equipment: None Additional Comments: wife is available 24/7; pt and spouse offered  translator and declined during evaluation Prior Function Level of Independence: Independent with basic ADLs;Independent with homemaking with ambulation;Independent with gait;Independent with transfers Driving: No Vocation: Unemployed ADL ADL Grooming: Simulated;Wash/dry face;Minimal assistance Where Assessed - Grooming: Sitting, bed;Unsupported Upper Body Bathing: Simulated;Chest;Right arm;Abdomen;Moderate assistance Where Assessed - Upper Body Bathing: Sitting, chair;Unsupported Lower Body Dressing: Simulated;Moderate assistance Where Assessed - Lower Body Dressing: Sitting,  chair;Unsupported Ambulation Related to ADLs: Pt completed sit<.stand transfer at EOB with Rt LE knee blocked. Pt sit stepped toward foot of bed with mod A with faciliation of weight shift to abduct Rt LE and blocking of Rt LE to weight bear with hyper extension noted. ADL Comments: pt will require setup for ADLS with (A) on Rt side ADLs. Pt with active shruggle of shoulder on Rt side. Pt with flaccid Brunstrom I Rt UE  Vision/Perception  Vision - History Visual History: Other (comment) (reporting blurred vision) Patient Visual Report: Blurring of vision;Unable to keep objects in focus Vision - Assessment Eye Alignment: Within Functional Limits Vision Assessment: Vision tested Ocular Range of Motion: Within Functional Limits Tracking/Visual Pursuits: Decreased smoothness of vertical tracking;Unable to hold eye position out of midline Saccades: Additional eye shifts occurred during testing Convergence: Impaired - to be further tested in functional context Cognition Cognition Arousal/Alertness: Awake/alert Orientation Level: Oriented X4 Cognition - Other Comments: mild increased processing time, likely due to language barrier Sensation/Coordination Sensation Light Touch: Appears Intact Additional Comments: numbness in Rt UE with tingling reported Coordination Gross Motor Movements are Fluid and Coordinated:  No Fine Motor Movements are Fluid and Coordinated: No Extremity Assessment RUE Assessment RUE Assessment: Exceptions to [facility] LUE Assessment LUE Assessment: Within Functional Limits Mobility  Bed Mobility Bed Mobility: Yes Rolling Right: 4: Min assist;With rail Rolling Right Details (indicate cue type and reason): decreased active trunk rotation noted Right Sidelying to Sit: 3: Mod assist;HOB flat;With rails Right Sidelying to Sit Details (indicate cue type and reason): decreased active trunk inititation and weight shift noted Supine to Sit: 4: Min assist;With rails;HOB flat Sitting - Scoot to Edge of Bed: 4: Min assist Transfers Transfers: Yes Sit to Stand: 4: Min assist;With upper extremity assist;From bed Sit to Stand Details (indicate cue type and reason): Rt Le blocked Stand to Sit: 4: Min assist;To bed;With upper extremity assist Stand to Sit Details: pt controlling descend to bed Exercises   End of Session OT - End of Session Equipment Utilized During Treatment: Gait belt Activity Tolerance: Patient tolerated treatment well Patient left: in bed;with call bell in reach;with family/visitor present (wife and friend) Nurse Communication: Mobility status for transfers General Behavior During Session: [facility] for tasks performed Cognition: [facility] for tasks performed   Lucile Shutters 04/04/2011, 1:52 PM  Pager: [phone]

## 2011-04-04 NOTE — Progress Notes (Signed)
Physical Therapy Evaluation Patient Details Name: Edward Choi MRN: [id number] DOB: November 09, 1950 Today's Date: 04/04/2011  Problem List:  Patient Active Problem List  Diagnoses  . CAD (coronary artery disease)  . CVA (cerebrovascular accident)  . Hypertension  . Depression  . Hx of CABG  . Hyperlipidemia    Past Medical History:  Past Medical History  Diagnosis Date  . Hypertension   . Acute MI   . Coronary artery disease   . Depression   . Back pain    Past Surgical History:  Past Surgical History  Procedure Date  . Coronary artery bypass graft 2006    PT Assessment/Plan/Recommendation PT Assessment Clinical Impression Statement: Patient presents with dominant right side hemiplgia, decreased postural control, delayed balance and righting reactions, mild motor apraxia, blurry vision of new onset, decreasde sustained trunk and right LE muscle activation, and overall decreased activity tolerance resulting in increased risk of fall and decreased functional independence. Acute PT to follow patient at frequencing of 4x/week during acute hospital stay to address these impairments for maximum functional gain. Recommend CIR consult for further therapies in CIR setting prior to discharge home. Patient and wife in agreement with this plan. Also discussed use of staff assistance for all mobility to prevent fall during hospital stay. Patient and wife verbalized agreement. PT Recommendation/Assessment: Patient will need skilled PT in the acute care venue PT Problem List: Decreased strength;Decreased activity tolerance;Decreased balance;Decreased mobility;Decreased coordination;Decreased knowledge of use of DME;Decreased safety awareness Barriers to Discharge: None PT Therapy Diagnosis : Abnormality of gait;Generalized weakness;Hemiplegia dominant side PT Plan PT Frequency: Min 4X/week PT Treatment/Interventions: DME instruction;Gait training;Stair training;Functional mobility  training;Therapeutic activities;Therapeutic exercise;Balance training;Neuromuscular re-education;Wheelchair mobility training;Patient/family education PT Recommendation Recommendations for Other Services: Rehab consult Follow Up Recommendations: Inpatient Rehab Equipment Recommended: Defer to next venue PT Goals  Acute Rehab PT Goals PT Goal Formulation: With patient (and wife) Time For Goal Achievement: 7 days Pt will go Supine/Side to Sit: with supervision;with HOB not 0 degrees (comment degree) PT Goal: Supine/Side to Sit - Progress: Goal set today Pt will Sit at [facility] of Bed: with supervision;with no upper extremity support;6-10 min PT Goal: Sit at Edge Of Bed - Progress: Goal set today Pt will go Sit to Supine/Side: with supervision;with HOB not 0 degrees (comment degree) PT Goal: Sit to Supine/Side - Progress: Goal set today Pt will go Sit to Stand: with min assist;with upper extremity assist PT Goal: Sit to Stand - Progress: Goal set today Pt will go Stand to Sit: with min assist;with upper extremity assist PT Goal: Stand to Sit - Progress: Goal set today Pt will Transfer Bed to Chair/Chair to Bed: with min assist PT Transfer Goal: Bed to Chair/Chair to Bed - Progress: Goal set today Pt will Stand: with min assist;6 - 10 min;with unilateral upper extremity support PT Goal: Stand - Progress: Goal set today Pt will Ambulate: 1 - 15 feet;with mod assist;with least restrictive assistive device PT Goal: Ambulate - Progress: Goal set today  PT Evaluation Precautions/Restrictions  Precautions Precautions: Fall Precaution Comments: blurry vision, right sided hemiplegia Required Braces or Orthoses: No Restrictions Weight Bearing Restrictions: No Prior Functioning  Home Living Lives With: Spouse;Family Receives Help From: Family Type of Home: House Home Layout: One level Home Access: Stairs to enter Entrance Stairs-Rails: Right;Left;Can reach both Entrance Stairs-Number of  Steps: 2-3 Bathroom Toilet: Standard Bathroom Accessibility: Yes How Accessible: Accessible via walker Home Adaptive Equipment: None Prior Function Level of Independence: Independent with basic ADLs;Independent with homemaking with  ambulation;Independent with gait;Independent with transfers Driving: No Vocation: Unemployed Cognition Cognition Arousal/Alertness: Lethargic Overall Cognitive Status: Appears within functional limits for tasks assessed Orientation Level: Oriented X4 Cognition - Other Comments: mild increased processing time, likely due to language barrier Sensation/Coordination Sensation Light Touch: Appears Intact Stereognosis: Not tested Hot/Cold: Not tested Proprioception: Appears Intact Additional Comments: symmetrical, no numbness or tingling. Proprioception assessed at bilat great toes. Coordination Gross Motor Movements are Fluid and Coordinated: No Fine Motor Movements are Fluid and Coordinated: No Coordination and Movement Description: decreased grading of gross motor movements and weight shifts in all planes Heel Shin Test: WNL left LE, unable to assess right LE due to inadequate strength to achieve full ROM for isolated testing. Extremity Assessment RLE Assessment RLE Assessment: Exceptions to WFL (PROM WNL, strength grossly 2/5 except ankle DF 1/5.) LLE Assessment LLE Assessment: Within Functional Limits (AROM WNL. Strength grossly 4/5.) Mobility (including Balance) Bed Mobility Bed Mobility: Yes Rolling Right: 4: Min assist;With rail Rolling Right Details (indicate cue type and reason): decreased active trunk rotation noted Right Sidelying to Sit: 3: Mod assist;HOB flat;With rails Right Sidelying to Sit Details (indicate cue type and reason): decreased active trunk inititation and weight shift noted Sitting - Scoot to Edge of Bed: 3: Mod assist Sitting - Scoot to Edge of Bed Details (indicate cue type and reason): decreased sequencing of functional  task, posterior right lean, decreased graded weight shifts in all planes Transfers Transfers: Yes Sit to Stand: 3: Mod assist Sit to Stand Details (indicate cue type and reason): decreased initition right right LE, shifted left Stand to Sit: 3: Mod assist Stand to Sit Details: decreased eccentric control Stand Pivot Transfers: 2: Max assist Stand Pivot Transfer Details (indicate cue type and reason): assist to lift and to lower, decreased sequencing of functional task Ambulation/Gait Ambulation/Gait: Yes Ambulation/Gait Assistance: 1: +1 Total assist Ambulation/Gait Assistance Details (indicate cue type and reason): manual facilitation for right stance components and anterior laterl weight shift to advance left LE Ambulation Distance (Feet): 3 Feet Assistive device:  (right hand held assist) Gait Pattern: Decreased stance time - right;Decreased step length - left (collapses onto right side, decreased sustained right stance) Gait velocity: significantly decreased Stairs: No Wheelchair Mobility Wheelchair Mobility: No  Posture/Postural Control Posture/Postural Control: Postural limitations Postural Limitations: decreased sustained right trunk elongation, collapsing into right flexion. Postural muscles fatigue quickly in unsupported sitting. Initiates movement and balance with upper trunk. Balance and righting reactions delayed. Balance Balance Assessed: Yes Static Sitting Balance Static Sitting - Balance Support: No upper extremity supported;Feet supported Static Sitting - Level of Assistance: 4: Min assist Dynamic Sitting Balance Dynamic Sitting - Balance Support: No upper extremity supported;Feet supported;During functional activity Dynamic Sitting - Level of Assistance: 3: Mod assist Static Standing Balance Static Standing - Balance Support: Right upper extremity supported Static Standing - Level of Assistance: 3: Mod assist Dynamic Standing Balance Dynamic Standing - Balance  Support: Right upper extremity supported (left heel lifts and pre-gait activity) Dynamic Standing - Level of Assistance: 2: Max assist   End of Session PT - End of Session Equipment Utilized During Treatment: Gait belt Activity Tolerance: Patient tolerated treatment well Patient left: in chair;with call bell in reach;with family/visitor present Nurse Communication: Mobility status for transfers General Behavior During Session: [facility] for tasks performed Cognition: [facility] for tasks performed  [name] 04/04/2011, 9:24 AM

## 2011-04-04 NOTE — Plan of Care (Signed)
Problem: Phase II Progression Outcomes Goal: Tolerating diet Outcome: Progressing Ceferino Lang B. Aleisa Howk, MSP, CCC-SLP [phone]        

## 2011-04-04 NOTE — Consult Note (Signed)
Physical Medicine and Rehabilitation Consult Reason for Consult: Cerebrovascular accident Referring Phsyician: Dr. Aundria Rud is an 61 y.o. male.   HPI: 61 year old right-handed [place] male with history of coronary artery disease with bypass grafting in 2006. Patient on no medications prior to hospital admission with questionable medical compliance. Patient independent prior to admission. Admitted January 21 with right-sided weakness. 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 MRA of the head with mild intracranial atherosclerotic type changes. Patient did not receive TPA. Echocardiogram and carotid Dopplers were pending. Neurology services consulted placed on aspirin therapy as well as subcutaneous Lovenox. Physical therapy evaluation January 22 as patient was moderate assist for sit to stand and stand to sit. Require +1 total assist to ambulate 3 feet. Occupational therapy evaluations pending.  Review of Systems  Constitutional: Negative for malaise/fatigue.  Eyes: Negative for double vision.  Respiratory: Negative for cough and shortness of breath.   Cardiovascular: Negative for chest pain.  Gastrointestinal: Negative for nausea.  Genitourinary: Negative.   Musculoskeletal: Negative for myalgias.  Neurological: Negative for dizziness and headaches.  Psychiatric/Behavioral: Negative.    Past Medical History  Diagnosis Date  . Hypertension   . Acute MI   . Coronary artery disease   . Depression   . Back pain    Past Surgical History  Procedure Date  . Coronary artery bypass graft 2006   History reviewed. No pertinent family history. Social History:  reports that he has never smoked. He has never used smokeless tobacco. He reports that he does not drink alcohol or use illicit drugs. Allergies: No Known Allergies Medications Prior to Admission  Medication Dose Route Frequency Provider Last Rate Last Dose  . 0.9 %  sodium chloride  infusion   Intravenous Continuous Toy Baker, MD 100 mL/hr at 04/04/11 0500    . 0.9 %  sodium chloride infusion  250 mL Intravenous PRN Toy Baker, MD      . 0.9 %  sodium chloride infusion  250 mL Intravenous PRN Nishant Dhungel, MD      . aspirin tablet 325 mg  325 mg Oral Daily Nishant Dhungel, MD   325 mg at 04/04/11 1001  . clopidogrel (PLAVIX) tablet 300 mg  300 mg Oral Once Warnell Forester, MD   300 mg at 04/03/11 2019  . enoxaparin (LOVENOX) injection 40 mg  40 mg Subcutaneous Q24H Nishant Dhungel, MD   40 mg at 04/04/11 1002  . influenza  inactive virus vaccine (FLUZONE/FLUARIX) injection 0.5 mL  0.5 mL Intramuscular Tomorrow-1000 Nishant Dhungel, MD      . rosuvastatin (CRESTOR) tablet 40 mg  40 mg Oral q1800 Nishant Dhungel, MD      . senna-docusate (Senokot-S) tablet 1 tablet  1 tablet Oral QHS PRN Nishant Dhungel, MD      . sodium chloride 0.9 % injection 3 mL  3 mL Intravenous Q12H Toy Baker, MD   3 mL at 04/03/11 2300  . sodium chloride 0.9 % injection 3 mL  3 mL Intravenous PRN Toy Baker, MD      . sodium chloride 0.9 % injection 3 mL  3 mL Intravenous Q12H Nishant Dhungel, MD      . sodium chloride 0.9 % injection 3 mL  3 mL Intravenous PRN Nishant Dhungel, MD      . DISCONTD: aspirin suppository 300 mg  300 mg Rectal Daily Nishant Dhungel, MD      . DISCONTD: enoxaparin (LOVENOX) injection  40 mg  40 mg Subcutaneous Q24H Nishant Dhungel, MD       No current outpatient prescriptions on file as of 04/04/2011.    Home: Home Living Lives With: Spouse;Family Receives Help From: Family Type of Home: House Home Layout: One level Home Access: Stairs to enter Entrance Stairs-Rails: Right;Left;Can reach both Entrance Stairs-Number of Steps: 2-3 Bathroom Toilet: Standard Bathroom Accessibility: Yes How Accessible: Accessible via walker Home Adaptive Equipment: None  Functional History: Prior Function Level of Independence: Independent with basic  ADLs;Independent with homemaking with ambulation;Independent with gait;Independent with transfers Driving: No Vocation: Unemployed Functional Status:  Mobility: Bed Mobility Bed Mobility: Yes Rolling Right: 4: Min assist;With rail Rolling Right Details (indicate cue type and reason): decreased active trunk rotation noted Right Sidelying to Sit: 3: Mod assist;HOB flat;With rails Right Sidelying to Sit Details (indicate cue type and reason): decreased active trunk inititation and weight shift noted Sitting - Scoot to Edge of Bed: 3: Mod assist Sitting - Scoot to Edge of Bed Details (indicate cue type and reason): decreased sequencing of functional task, posterior right lean, decreased graded weight shifts in all planes Transfers Transfers: Yes Sit to Stand: 3: Mod assist Sit to Stand Details (indicate cue type and reason): decreased initition right right LE, shifted left Stand to Sit: 3: Mod assist Stand to Sit Details: decreased eccentric control Stand Pivot Transfers: 2: Max assist Stand Pivot Transfer Details (indicate cue type and reason): assist to lift and to lower, decreased sequencing of functional task Ambulation/Gait Ambulation/Gait: Yes Ambulation/Gait Assistance: 1: +1 Total assist Ambulation/Gait Assistance Details (indicate cue type and reason): manual facilitation for right stance components and anterior laterl weight shift to advance left LE Ambulation Distance (Feet): 3 Feet Assistive device:  (right hand held assist) Gait Pattern: Decreased stance time - right;Decreased step length - left (collapses onto right side, decreased sustained right stance) Gait velocity: significantly decreased Stairs: No Wheelchair Mobility Wheelchair Mobility: No  ADL:    Cognition: Cognition Arousal/Alertness: Lethargic Orientation Level: Oriented X4 Cognition Arousal/Alertness: Lethargic Overall Cognitive Status: Appears within functional limits for tasks assessed Orientation  Level: Oriented X4 Cognition - Other Comments: mild increased processing time, likely due to language barrier  Blood pressure 155/89, pulse 71, temperature 98.4 F (36.9 C), temperature source Oral, resp. rate 16, height 5\' 2"  (1.575 m), weight 58.2 kg (128 lb 4.9 oz), SpO2 93.00%. Physical Exam  Constitutional:       Family member at bedside to assist with exam due to language barrier with patient  HENT:  Head: Normocephalic.  Neck: Normal range of motion. No thyromegaly present.  Cardiovascular: Normal rate and regular rhythm.   Pulmonary/Chest: Breath sounds normal. He has no wheezes.  Abdominal: He exhibits no distension. There is no tenderness.  Musculoskeletal: He exhibits no edema.  Neurological: He is alert.       Follows basic two-step motor commands. Answers simple questions. There is a language barrier but patient can communicate his needs. Patient with 1/5 strength intermittently throughout the right upper extremity. He is perhaps 1-1+ out of 5 right lower extremity with some motor planning issues. Is a right central 7 and tongue deviation. Speech is slightly dysarthric. Sensation is one of 2 right arm and leg. Reflexes are 1+ throughout.  Skin: Skin is warm and dry.  Psychiatric: He has a normal mood and affect.    Results for orders placed during the hospital encounter of 04/03/11 (from the past 24 hour(s))  PROTIME-INR     Status: Normal  Collection Time   04/03/11  7:52 PM      Component Value Range   Prothrombin Time 13.1  11.6 - 15.2 (seconds)   INR 0.97  0.00 - 1.49   APTT     Status: Abnormal   Collection Time   04/03/11  7:52 PM      Component Value Range   aPTT 38 (*) 24 - 37 (seconds)  CBC     Status: Abnormal   Collection Time   04/03/11  7:52 PM      Component Value Range   WBC 9.4  4.0 - 10.5 (K/uL)   RBC 7.30 (*) 4.22 - 5.81 (MIL/uL)   Hemoglobin 14.3  13.0 - 17.0 (g/dL)   HCT 16.1  09.6 - 04.5 (%)   MCV 59.5 (*) 78.0 - 100.0 (fL)   MCH 19.6 (*) 26.0  - 34.0 (pg)   MCHC 32.9  30.0 - 36.0 (g/dL)   RDW 40.9 (*) 81.1 - 15.5 (%)   Platelets 248  150 - 400 (K/uL)  DIFFERENTIAL     Status: Abnormal   Collection Time   04/03/11  7:52 PM      Component Value Range   Neutrophils Relative 78 (*) 43 - 77 (%)   Neutro Abs 7.3  1.7 - 7.7 (K/uL)   Lymphocytes Relative 14  12 - 46 (%)   Lymphs Abs 1.3  0.7 - 4.0 (K/uL)   Monocytes Relative 8  3 - 12 (%)   Monocytes Absolute 0.7  0.1 - 1.0 (K/uL)   Eosinophils Relative 0  0 - 5 (%)   Eosinophils Absolute 0.0  0.0 - 0.7 (K/uL)   Basophils Relative 0  0 - 1 (%)   Basophils Absolute 0.0  0.0 - 0.1 (K/uL)  COMPREHENSIVE METABOLIC PANEL     Status: Abnormal   Collection Time   04/03/11  7:52 PM      Component Value Range   Sodium 138  135 - 145 (mEq/L)   Potassium 3.6  3.5 - 5.1 (mEq/L)   Chloride 101  96 - 112 (mEq/L)   CO2 23  19 - 32 (mEq/L)   Glucose, Bld 167 (*) 70 - 99 (mg/dL)   BUN 16  6 - 23 (mg/dL)   Creatinine, Ser 9.14  0.50 - 1.35 (mg/dL)   Calcium 9.2  8.4 - 78.2 (mg/dL)   Total Protein 8.2  6.0 - 8.3 (g/dL)   Albumin 3.9  3.5 - 5.2 (g/dL)   AST 22  0 - 37 (U/L)   ALT 17  0 - 53 (U/L)   Alkaline Phosphatase 83  39 - 117 (U/L)   Total Bilirubin 0.6  0.3 - 1.2 (mg/dL)   GFR calc non Af Amer 90 (*) >90 (mL/min)   GFR calc Af Amer >90  >90 (mL/min)  CK TOTAL AND CKMB     Status: Abnormal   Collection Time   04/03/11  7:52 PM      Component Value Range   Total CK 324 (*) 7 - 232 (U/L)   CK, MB 1.7  0.3 - 4.0 (ng/mL)   Relative Index 0.5  0.0 - 2.5   TROPONIN I     Status: Normal   Collection Time   04/03/11  7:52 PM      Component Value Range   Troponin I <0.30  <0.30 (ng/mL)  URINE RAPID DRUG SCREEN (HOSP PERFORMED)     Status: Normal   Collection Time   04/03/11  8:31  PM      Component Value Range   Opiates NONE DETECTED  NONE DETECTED    Cocaine NONE DETECTED  NONE DETECTED    Benzodiazepines NONE DETECTED  NONE DETECTED    Amphetamines NONE DETECTED  NONE DETECTED     Tetrahydrocannabinol NONE DETECTED  NONE DETECTED    Barbiturates NONE DETECTED  NONE DETECTED   LIPID PANEL     Status: Abnormal   Collection Time   04/04/11  6:30 AM      Component Value Range   Cholesterol 232 (*) 0 - 200 (mg/dL)   Triglycerides 528  <413 (mg/dL)   HDL 37 (*) >24 (mg/dL)   Total CHOL/HDL Ratio 6.3     VLDL 22  0 - 40 (mg/dL)   LDL Cholesterol 401 (*) 0 - 99 (mg/dL)  CARDIAC PANEL(CRET KIN+CKTOT+MB+TROPI)     Status: Normal (Preliminary result)   Collection Time   04/04/11  8:23 AM      Component Value Range   Total CK PENDING  7 - 232 (U/L)   CK, MB 2.3  0.3 - 4.0 (ng/mL)   Troponin I <0.30  <0.30 (ng/mL)   Relative Index PENDING  0.0 - 2.5    Ct Head Wo Contrast  04/03/2011  *RADIOLOGY REPORT*  Clinical Data: Right side weakness.  CT HEAD WITHOUT CONTRAST  Technique:  Contiguous axial images were obtained from the base of the skull through the vertex without contrast.  Comparison: None.  Findings: There is no evidence of acute intracranial abnormality including acute infarction, hemorrhage, mass lesion, mass effect, midline shift or abnormal excessive fluid collection.  No hydrocephalus or pneumocephalus.  Mild chronic microvascular ischemic change noted.  Calvarium intact.  IMPRESSION: No acute finding.  Original Report Authenticated By: Bernadene Bell. Maricela Curet, M.D.   Mr Maxine Glenn Head Wo Contrast  04/04/2011  *RADIOLOGY REPORT*  Clinical Data:  Right-sided weakness.  MRI HEAD WITHOUT CONTRAST MRA HEAD WITHOUT CONTRAST  Technique: Multiplanar, multiecho pulse sequences of the brain and surrounding structures were obtained according to standard protocol without intravenous contrast.  Angiographic images of the head were obtained using MRA technique without contrast.  Comparison: 04/03/2011 CT.  MRI HEAD  Findings:  Left paracentral pontine acute non hemorrhagic infarct.  Moderate nonspecific white matter type changes probably related to result of small vessel disease given the  present findings.  No intracranial hemorrhage.  Global atrophy without hydrocephalus.  No intracranial mass lesion detected on this unenhanced exam.  Partial opacification left mastoid air cells without obstructing lesion noted.  Mild mucosal thickening ethmoid sinus air cells.  Minimal anterior slip C2 with mild spinal stenosis.  IMPRESSION: Left paracentral pontine acute non hemorrhagic infarct.  Moderate nonspecific white matter type changes probably related to result of small vessel disease.  Global atrophy without hydrocephalus.  Partial opacification left mastoid air cells without obstructing lesion noted.  Mild mucosal thickening ethmoid sinus air cells.  MRA HEAD  Findings: Mild narrowing proximal A1 segment right anterior cerebral artery.  Mild narrowing right middle cerebral artery bifurcation and proximal right middle cerebral artery branch vessels.  Otherwise anterior circulation without medium or large size vessel significant stenosis or occlusion.  Codominant vertebral arteries appear ectatic with mild narrowing of the distal left vertebral artery and mild narrowing of the right vertebral artery at the takeoff of the right PICA.  Irregularity with mild narrowing mid and distal aspect of the basilar artery.  Nonvisualization right AICA with diminutive size of left AICA.  Mild irregularity  superior cerebellar arteries bilaterally.  Posterior cerebral artery distal branch vessel irregularity.  No aneurysm noted.  IMPRESSION: Mild intracranial atherosclerotic type changes as noted above.  Original Report Authenticated By: Fuller Canada, M.D.   Mr Brain Wo Contrast  04/04/2011  *RADIOLOGY REPORT*  Clinical Data:  Right-sided weakness.  MRI HEAD WITHOUT CONTRAST MRA HEAD WITHOUT CONTRAST  Technique: Multiplanar, multiecho pulse sequences of the brain and surrounding structures were obtained according to standard protocol without intravenous contrast.  Angiographic images of the head were obtained using  MRA technique without contrast.  Comparison: 04/03/2011 CT.  MRI HEAD  Findings:  Left paracentral pontine acute non hemorrhagic infarct.  Moderate nonspecific white matter type changes probably related to result of small vessel disease given the present findings.  No intracranial hemorrhage.  Global atrophy without hydrocephalus.  No intracranial mass lesion detected on this unenhanced exam.  Partial opacification left mastoid air cells without obstructing lesion noted.  Mild mucosal thickening ethmoid sinus air cells.  Minimal anterior slip C2 with mild spinal stenosis.  IMPRESSION: Left paracentral pontine acute non hemorrhagic infarct.  Moderate nonspecific white matter type changes probably related to result of small vessel disease.  Global atrophy without hydrocephalus.  Partial opacification left mastoid air cells without obstructing lesion noted.  Mild mucosal thickening ethmoid sinus air cells.  MRA HEAD  Findings: Mild narrowing proximal A1 segment right anterior cerebral artery.  Mild narrowing right middle cerebral artery bifurcation and proximal right middle cerebral artery branch vessels.  Otherwise anterior circulation without medium or large size vessel significant stenosis or occlusion.  Codominant vertebral arteries appear ectatic with mild narrowing of the distal left vertebral artery and mild narrowing of the right vertebral artery at the takeoff of the right PICA.  Irregularity with mild narrowing mid and distal aspect of the basilar artery.  Nonvisualization right AICA with diminutive size of left AICA.  Mild irregularity superior cerebellar arteries bilaterally.  Posterior cerebral artery distal branch vessel irregularity.  No aneurysm noted.  IMPRESSION: Mild intracranial atherosclerotic type changes as noted above.  Original Report Authenticated By: Fuller Canada, M.D.   Dg Chest Portable 1 View  04/03/2011  *RADIOLOGY REPORT*  Clinical Data: Acute right-sided weakness.  PORTABLE CHEST  - 1 VIEW  Comparison: None.  Findings: Heart size and pulmonary vascularity are normal and the lungs are clear.  Markings are slightly accentuated due to a shallow inspiration.  Prior CABG.  IMPRESSION: No acute abnormalities.  Original Report Authenticated By: Gwynn Burly, M.D.    Assessment/Plan: Diagnosis: Left paracentral pontine infarct 1. Does the need for close, 24 hr/day medical supervision in concert with the patient's rehab needs make it unreasonable for this patient to be served in a less intensive setting? Yes 2. Co-Morbidities requiring supervision/potential complications: CAD, hypertension, history of CABG, depression 3. Due to bladder management, bowel management, safety, skin/wound care, disease management, medication administration and patient education, does the patient require 24 hr/day rehab nursing? Yes 4. Does the patient require coordinated care of a physician, rehab nurse, PT (1-2 hrs/day, 5 days/week), OT (1-2 hrs/day, 5 days/week) and SLP (1-2 hrs/day, 5 days/week) to address physical and functional deficits in the context of the above medical diagnosis(es)? Yes Addressing deficits in the following areas: balance, bathing, bowel/bladder control, dressing, endurance, feeding, grooming, locomotion, psychosocial adjustment, speech, strength, swallowing, toileting and transferring 5. Can the patient actively participate in an intensive therapy program of at least 3 hrs of therapy per day at least 5 days per  week? Yes 6. The potential for patient to make measurable gains while on inpatient rehab is excellent 7. Anticipated functional outcomes upon discharge from inpatients are minimal assistance PT, minimal to moderate assist OT, modified independent SLP 8. Estimated rehab length of stay to reach the above functional goals is: 2-3 weeks 9. Does the patient have adequate social supports to accommodate these discharge functional goals? Yes 10. Anticipated D/C setting:  Home 11. Anticipated post D/C treatments: HH therapy 12. Overall Rehab/Functional Prognosis: excellent  RECOMMENDATIONS: This patient's condition is appropriate for continued rehabilitative care in the following setting: CIR Patient has agreed to participate in recommended program. Yes Note that insurance prior authorization may be required for reimbursement for recommended care.  Comment: Rehabilitation RN to followup   Ivory Broad, MD 04/04/2011

## 2011-04-04 NOTE — Progress Notes (Signed)
Stroke Team Progress Note  HISTORY Edward Choi is an 61 y.o. male presenting to the ER with right hemiparesis. The patient does not speak fluent English so the history and physical were conducted with the use of [place] translation via Radio producer. The patient apparently woke up 04/03/2011 morning around 5am with right sided weakness. His wife had already left for work so he lay in bed all day with continued weakness on the right. Around 5pm, his family was there and he fell out of bed trying to get out. The family sat him in a chair and then called EMS. The patient is also having trouble with his speech. He denies any numbness or tingling. He denies any problems with swallowing or vision. The patient has a slight headache but denies  dizzyness/vertigo/lightheadedness. He did not have nausea nor did he vomit. The patient has never had this happen before. He underwent a CABG in 2007 (?) and tells me he has high blood pressure and cholesterol but is not taking any medications at all. He was not a tPA candidate as he presented beyond the treatment window. He was admitted for further evaluation and treatment.  SUBJECTIVE His daughter is at the bedside. Overall he feels his condition is stable. He understand most [place]. He lives with his daughter and 79 yo wife. His daughter works.  OBJECTIVE Most recent Vital Signs: Temp: 98.4 F (36.9 C) (01/22 9604) Temp src: Oral (01/22 5409) BP: 155/89 mmHg (01/22 8119) Pulse Rate: 71  (01/22 0633) Respiratory Rate: 16 O2 Saturation: 93%  CBG (last 3) No results found for this basename: GLUCAP:3 in the last 72 hours Intake/Output from previous day:   IV Fluid Intake:     . sodium chloride 100 mL/hr at 04/04/11 0500   Medications    . aspirin  300 mg Rectal Daily   Or  . aspirin  325 mg Oral Daily  . clopidogrel  300 mg Oral Once  . enoxaparin  40 mg Subcutaneous Q24H  . influenza  inactive virus vaccine  0.5 mL Intramuscular Tomorrow-1000  .  rosuvastatin  40 mg Oral q1800  . sodium chloride  3 mL Intravenous Q12H  . sodium chloride  3 mL Intravenous Q12H  . DISCONTD: enoxaparin  40 mg Subcutaneous Q24H  PRN sodium chloride, sodium chloride, senna-docusate, sodium chloride, sodium chloride  Diet:  Cardiac thin liquids Activity:  Up with assistance DVT Prophylaxis:  Lovenox 40 mg sq daily   Studies: CBC    Component Value Date/Time   WBC 9.4 04/03/2011 1952   RBC 7.30* 04/03/2011 1952   HGB 14.3 04/03/2011 1952   HCT 43.4 04/03/2011 1952   PLT 248 04/03/2011 1952   MCV 59.5* 04/03/2011 1952   MCH 19.6* 04/03/2011 1952   MCHC 32.9 04/03/2011 1952   RDW 19.2* 04/03/2011 1952   LYMPHSABS 1.3 04/03/2011 1952   MONOABS 0.7 04/03/2011 1952   EOSABS 0.0 04/03/2011 1952   BASOSABS 0.0 04/03/2011 1952   CMP    Component Value Date/Time   NA 138 04/03/2011 1952   K 3.6 04/03/2011 1952   CL 101 04/03/2011 1952   CO2 23 04/03/2011 1952   GLUCOSE 167* 04/03/2011 1952   BUN 16 04/03/2011 1952   CREATININE 0.92 04/03/2011 1952   CALCIUM 9.2 04/03/2011 1952   PROT 8.2 04/03/2011 1952   ALBUMIN 3.9 04/03/2011 1952   AST 22 04/03/2011 1952   ALT 17 04/03/2011 1952   ALKPHOS 83 04/03/2011 1952   BILITOT 0.6 04/03/2011  1952   GFRNONAA 90* 04/03/2011 1952   GFRAA >90 04/03/2011 1952   COAGS Lab Results  Component Value Date   INR 0.97 04/03/2011   Lipid Panel Lipid Panel     Component Value Date/Time   CHOL 232* 04/04/2011 0630   TRIG 111 04/04/2011 0630   HDL 37* 04/04/2011 0630   CHOLHDL 6.3 04/04/2011 0630   VLDL 22 04/04/2011 0630   LDLCALC 173* 04/04/2011 0630   HgbA1C  No results found for this basename: HGBA1C   Urine Drug Screen     Component Value Date/Time   LABOPIA NONE DETECTED 04/03/2011 2031   COCAINSCRNUR NONE DETECTED 04/03/2011 2031   LABBENZ NONE DETECTED 04/03/2011 2031   AMPHETMU NONE DETECTED 04/03/2011 2031   THCU NONE DETECTED 04/03/2011 2031   LABBARB NONE DETECTED 04/03/2011 2031    Alcohol Level No results found  for this basename: eth  Cardiac enzymes negative  CT of the brain  No acute finding.    MRI of the brain  Left paracentral pontine acute non hemorrhagic infarct.  Moderate nonspecific white matter type changes probably related to result of small vessel disease.  Global atrophy without hydrocephalus.  Partial opacification left mastoid air cells without obstructing lesion noted.  Mild mucosal thickening ethmoid sinus air cells.    MRA of the brain   Mild intracranial atherosclerotic type changes  2D Echocardiogram  ordered  Carotid Doppler  ordered   CXR   No acute abnormalities.  EKG  ordered .   Physical Exam    Frail middle-aged Asian male currently not in distress. Afebrile. Head is nontraumatic. Neck is supple without bruit. Cardiac exam no murmur or gallop. Lungs are clear to auscultation. Distal pulses are well felt. There is no pedal edema.  Neurological exam awake alert oriented x3 with normal speech and language function. Extraocular movements are full range without nystagmus. Blinks to threat bilaterally. There is minimum right lower facial weakness. Tongue is midline. Motor system exam reveals dense right hemiplegia with 0/5 strength on the right upper extremity and grade 1-2/5 strength in the right lower extremity. Diminished tone on the right. Diminished touch and pinprick sensation on the right. Right plantar is upgoing left is downgoing. Coordination and gait were not tested.  ASSESSMENT Mr. Edward Choi is a 61 y.o. 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 Received clopidogrel 300 mg load for secondary stroke prevention. Will likely need inpatient rehab.  Hyperlipidemia, on zocor  Stroke risk factors:  hyperlipidemia, hypertension and CAD  Hospital day # 1  TREATMENT/PLAN Continue aspirin 325 mg orally every  day for secondary stroke prevention. Stop plavix, pt was not on antiplatelet prior to admission. Therapy evals and  Rehab consult I counseled the patient to be compliant with his medications. Discussed with patient and wife. Joaquin Music, ANP-BC, GNP-BC [facility] Pager: [phone] 04/04/2011 9:47 AM  Dr. Delia Heady, Stroke Center Medical Director, has personally reviewed chart, pertinent data, examined the patient and developed the plan of care.

## 2011-04-04 NOTE — Progress Notes (Signed)
Speech Language Pathology SLP Cancellation Note  Treatment cancelled today due to patient receiving procedure or test.  Will continue efforts. Celia B. Bueche, MSP, CCC-SLP [phone] pager 

## 2011-04-05 NOTE — Progress Notes (Signed)
Rehab admissions - Evaluated for possible admission.  I spoke with patient.  He says his wife works.  I will contact wife to see what caregiver support is available.  I will also need precert from [facility] before I can potentially admit to inpatient rehab.  Pager [phone]

## 2011-04-05 NOTE — Progress Notes (Signed)
Stroke Team Progress Note  HISTORY Edward Choi is an 61 y.o. male presenting to the ER with right hemiparesis. The patient does not speak fluent English so the history and physical were conducted with the use of [place] translation via Radio producer. The patient apparently woke up 04/03/2011 morning around 5am with right sided weakness. His wife had already left for work so he lay in bed all day with continued weakness on the right. Around 5pm, his family was there and he fell out of bed trying to get out. The family sat him in a chair and then called EMS. The patient is also having trouble with his speech. He denies any numbness or tingling. He denies any problems with swallowing or vision. The patient has a slight headache but denies  dizzyness/vertigo/lightheadedness. He did not have nausea nor did he vomit. The patient has never had this happen before. He underwent a CABG in 2007 (?) and tells me he has high blood pressure and cholesterol but is not taking any medications at all. He was not a tPA candidate as he presented beyond the treatment window. He was admitted for further evaluation and treatment.  He understand most [place]. He lives with his daughter and 33 yo wife. His daughter works.  SUBJECTIVE No family is at the bedside. Overall he feels his condition is stable.   OBJECTIVE Most recent Vital Signs: Temp: 98.3 F (36.8 C) (01/23 0600) BP: 143/85 mmHg (01/23 0600) Pulse Rate: 65  (01/23 0600) Respiratory Rate: 16 O2 Saturation: 95%  CBG (last 3) No results found for this basename: GLUCAP:3 in the last 72 hours Intake/Output from previous day:01/22 0701 - 01/23 0700 In: 1800 [P.O.:600; I.V.:1200] Out: 1150 [Urine:1150]  IV Fluid Intake:     . sodium chloride 100 mL/hr at 04/05/11 0624   Medications    . aspirin  325 mg Oral Daily  . enoxaparin  40 mg Subcutaneous Q24H  . influenza  inactive virus vaccine  0.5 mL Intramuscular Tomorrow-1000  . rosuvastatin  40 mg  Oral q1800  . sodium chloride  3 mL Intravenous Q12H  . sodium chloride  3 mL Intravenous Q12H  . DISCONTD: aspirin  300 mg Rectal Daily  PRN sodium chloride, sodium chloride, senna-docusate, sodium chloride, sodium chloride  Diet:  Dysphagia thin liquids Activity:  Up with assistance DVT Prophylaxis:  Lovenox 40 mg sq daily   Studies: CBC    Component Value Date/Time   WBC 9.4 04/03/2011 1952   RBC 7.30* 04/03/2011 1952   HGB 14.3 04/03/2011 1952   HCT 43.4 04/03/2011 1952   PLT 248 04/03/2011 1952   MCV 59.5* 04/03/2011 1952   MCH 19.6* 04/03/2011 1952   MCHC 32.9 04/03/2011 1952   RDW 19.2* 04/03/2011 1952   LYMPHSABS 1.3 04/03/2011 1952   MONOABS 0.7 04/03/2011 1952   EOSABS 0.0 04/03/2011 1952   BASOSABS 0.0 04/03/2011 1952   CMP    Component Value Date/Time   NA 138 04/03/2011 1952   K 3.6 04/03/2011 1952   CL 101 04/03/2011 1952   CO2 23 04/03/2011 1952   GLUCOSE 167* 04/03/2011 1952   BUN 16 04/03/2011 1952   CREATININE 0.92 04/03/2011 1952   CALCIUM 9.2 04/03/2011 1952   PROT 8.2 04/03/2011 1952   ALBUMIN 3.9 04/03/2011 1952   AST 22 04/03/2011 1952   ALT 17 04/03/2011 1952   ALKPHOS 83 04/03/2011 1952   BILITOT 0.6 04/03/2011 1952   GFRNONAA 90* 04/03/2011 1952   GFRAA >90 04/03/2011  1952   COAGS Lab Results  Component Value Date   INR 0.97 04/03/2011   Lipid Panel    Component Value Date/Time   CHOL 232* 04/04/2011 0630   TRIG 111 04/04/2011 0630   HDL 37* 04/04/2011 0630   CHOLHDL 6.3 04/04/2011 0630   VLDL 22 04/04/2011 0630   LDLCALC 173* 04/04/2011 0630   HgbA1C  Lab Results  Component Value Date   HGBA1C 5.5 04/04/2011   Urine Drug Screen     Component Value Date/Time   LABOPIA NONE DETECTED 04/03/2011 2031   COCAINSCRNUR NONE DETECTED 04/03/2011 2031   LABBENZ NONE DETECTED 04/03/2011 2031   AMPHETMU NONE DETECTED 04/03/2011 2031   THCU NONE DETECTED 04/03/2011 2031   LABBARB NONE DETECTED 04/03/2011 2031    Alcohol Level No results found for this basename: eth    Cardiac enzymes negative  CT of the brain  No acute finding.    MRI of the brain  Left paracentral pontine acute non hemorrhagic infarct.  Moderate nonspecific white matter type changes probably related to result of small vessel disease.  Global atrophy without hydrocephalus.  Partial opacification left mastoid air cells without obstructing lesion noted.  Mild mucosal thickening ethmoid sinus air cells.    MRA of the brain   Mild intracranial atherosclerotic type changes  2D Echocardiogram  EF 55-60% with no source of embolus.   Carotid Doppler  No internal carotid artery stenosis bilaterally. Vertebrals with antegrade flow bilaterally.   CXR   No acute abnormalities.  EKG  Sinus rhythm, right axis deviation.   Physical Exam    Frail middle-aged Asian male currently not in distress. Afebrile. Head is nontraumatic. Neck is supple without bruit. Cardiac exam no murmur or gallop. Lungs are clear to auscultation. Distal pulses are well felt. There is no pedal edema.  Neurological exam awake alert oriented x3 with normal speech and language function. Extraocular movements are full range without nystagmus. Blinks to threat bilaterally. There is minimum right lower facial weakness. Tongue is midline. Motor system exam reveals dense right hemiplegia with 0/5 strength on the right upper extremity and grade 1-2/5 strength in the right lower extremity. Diminished tone on the right. Diminished touch and pinprick sensation on the right. Right plantar is upgoing left is downgoing. Coordination and gait were not tested.  ASSESSMENT Mr. Edward Choi is a 61 y.o. 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 He has history of being noncompliant with his medications On aspirin 325 mg orally every day. Received clopidogrel 300 mg load for secondary stroke prevention. Recommend inpatient rehab.  Hyperlipidemia, on zocor  Stroke risk factors:   hyperlipidemia, hypertension and CAD  Hospital day # 2  TREATMENT/PLAN Continue aspirin 325 mg orally every day for secondary stroke prevention. Agree with plans for rehab. Will sign off. Follow up with Dr. Pearlean Brownie in 2 months.   Joaquin Music, ANP-BC, GNP-BC [facility] Pager: [phone] 04/05/2011 8:13 AM  Dr. Delia Heady, Stroke Center Medical Director, has personally reviewed chart, pertinent data, examined the patient and developed the plan of care.

## 2011-04-05 NOTE — Progress Notes (Signed)
Subjective: No change in strength in his right limbs. Denies any other complaints.  Objective: Blood pressure 143/85, pulse 65, temperature 98.3 F (36.8 C), temperature source Oral, resp. rate 16, height 5\' 2"  (1.575 m), weight 58.2 kg (128 lb 4.9 oz), SpO2 95.00%.  Intake/Output Summary (Last 24 hours) at 04/05/11 0939 Last data filed at 04/05/11 0645  Gross per 24 hour  Intake   1800 ml  Output   1150 ml  Net    650 ml    General Exam: Comfortable. Sitting up in a chair. Respiratory System: Clear. No increased work of breathing. Cardiovascular System: First and second heart sounds heard. Regular rate and rhythm. No JVD/murmurs. Telemetry shows sinus rhythm. Gastrointestinal System: Abdomen is non distended, soft and normal bowel sounds heard. Central Nervous System: Alert and oriented. Mild right lower facial weakness. Extremities: Grade 0 x 5 power all groups in the right upper extremity. Grade 2 x 5 power in right lower extremity.  Lab Results: Basic Metabolic Panel:  Basename 04/03/11 1952 04/03/11  NA 138 --  K 3.6 --  CL 101 --  CO2 23 --  GLUCOSE 167* --  BUN 16 --  CREATININE 0.92 0.87  CALCIUM 9.2 --  MG -- --  PHOS -- --   Liver Function Tests:  [facility] 04/03/11 1952  AST 22  ALT 17  ALKPHOS 83  BILITOT 0.6  PROT 8.2  ALBUMIN 3.9   No results found for this basename: LIPASE:2,AMYLASE:2 in the last 72 hours No results found for this basename: AMMONIA:2 in the last 72 hours CBC:  Basename 04/03/11 1952 04/03/11  WBC 9.4 9.0  NEUTROABS 7.3 --  HGB 14.3 12.8*  HCT 43.4 38.1*  MCV 59.5* 58.7*  PLT 248 220   Cardiac Enzymes:  Basename 04/04/11 1524 04/04/11 0823 04/03/11 1952  CKTOTAL 447* 512* 324*  CKMB 2.2 2.3 1.7  CKMBINDEX -- -- --  TROPONINI <0.30 <0.30 <0.30   BNP: No results found for this basename: PROBNP:3 in the last 72 hours D-Dimer: No results found for this basename: DDIMER:2 in the last 72 hours CBG: No results found for  this basename: GLUCAP:6 in the last 72 hours Hemoglobin A1C:  Basename 04/04/11 0630  HGBA1C 5.5   Fasting Lipid Panel:  Basename 04/04/11 0630  CHOL 232*  HDL 37*  LDLCALC 173*  TRIG 111  CHOLHDL 6.3  LDLDIRECT --   Thyroid Function Tests:  Basename 04/03/11  TSH 1.605  T4TOTAL --  FREET4 --  T3FREE --  THYROIDAB --   Anemia Panel: No results found for this basename: VITAMINB12,FOLATE,FERRITIN,TIBC,IRON,RETICCTPCT in the last 72 hours Coagulation:  Basename 04/03/11 1952  LABPROT 13.1  INR 0.97   Urine Drug Screen: Drugs of Abuse     Component Value Date/Time   LABOPIA NONE DETECTED 04/03/2011 2031   COCAINSCRNUR NONE DETECTED 04/03/2011 2031   LABBENZ NONE DETECTED 04/03/2011 2031   AMPHETMU NONE DETECTED 04/03/2011 2031   THCU NONE DETECTED 04/03/2011 2031   LABBARB NONE DETECTED 04/03/2011 2031    Alcohol Level: No results found for this basename: ETH:2 in the last 72 hours  Micro Results: No results found for this or any previous visit (from the past 240 hour(s)).  Studies/Results: Ct Head Wo Contrast  04/03/2011  *RADIOLOGY REPORT*  Clinical Data: Right side weakness.  CT HEAD WITHOUT CONTRAST  Technique:  Contiguous axial images were obtained from the base of the skull through the vertex without contrast.  Comparison: None.  Findings: There is no  evidence of acute intracranial abnormality including acute infarction, hemorrhage, mass lesion, mass effect, midline shift or abnormal excessive fluid collection.  No hydrocephalus or pneumocephalus.  Mild chronic microvascular ischemic change noted.  Calvarium intact.  IMPRESSION: No acute finding.  Original Report Authenticated By: Bernadene Bell. Maricela Curet, M.D.   Mr Maxine Glenn Head Wo Contrast  04/04/2011  *RADIOLOGY REPORT*  Clinical Data:  Right-sided weakness.  MRI HEAD WITHOUT CONTRAST MRA HEAD WITHOUT CONTRAST  Technique: Multiplanar, multiecho pulse sequences of the brain and surrounding structures were obtained according  to standard protocol without intravenous contrast.  Angiographic images of the head were obtained using MRA technique without contrast.  Comparison: 04/03/2011 CT.  MRI HEAD  Findings:  Left paracentral pontine acute non hemorrhagic infarct.  Moderate nonspecific white matter type changes probably related to result of small vessel disease given the present findings.  No intracranial hemorrhage.  Global atrophy without hydrocephalus.  No intracranial mass lesion detected on this unenhanced exam.  Partial opacification left mastoid air cells without obstructing lesion noted.  Mild mucosal thickening ethmoid sinus air cells.  Minimal anterior slip C2 with mild spinal stenosis.  IMPRESSION: Left paracentral pontine acute non hemorrhagic infarct.  Moderate nonspecific white matter type changes probably related to result of small vessel disease.  Global atrophy without hydrocephalus.  Partial opacification left mastoid air cells without obstructing lesion noted.  Mild mucosal thickening ethmoid sinus air cells.  MRA HEAD  Findings: Mild narrowing proximal A1 segment right anterior cerebral artery.  Mild narrowing right middle cerebral artery bifurcation and proximal right middle cerebral artery branch vessels.  Otherwise anterior circulation without medium or large size vessel significant stenosis or occlusion.  Codominant vertebral arteries appear ectatic with mild narrowing of the distal left vertebral artery and mild narrowing of the right vertebral artery at the takeoff of the right PICA.  Irregularity with mild narrowing mid and distal aspect of the basilar artery.  Nonvisualization right AICA with diminutive size of left AICA.  Mild irregularity superior cerebellar arteries bilaterally.  Posterior cerebral artery distal branch vessel irregularity.  No aneurysm noted.  IMPRESSION: Mild intracranial atherosclerotic type changes as noted above.  Original Report Authenticated By: Fuller Canada, M.D.    Dg Chest  Portable 1 View  04/03/2011  *RADIOLOGY REPORT*  Clinical Data: Acute right-sided weakness.  PORTABLE CHEST - 1 VIEW  Comparison: None.  Findings: Heart size and pulmonary vascularity are normal and the lungs are clear.  Markings are slightly accentuated due to a shallow inspiration.  Prior CABG.  IMPRESSION: No acute abnormalities.  Original Report Authenticated By: Gwynn Burly, M.D.   Carotid Doppler Bilaterally no significant ICA stenosis with antegrade vertebral flow  2-D echo:   Study Conclusions:  - Left ventricle: The cavity size was normal. Systolic function was normal. The estimated ejection fraction was in the range of 55% to 60%. Wall motion was normal; there were no regional wall motion abnormalities. Left ventricular diastolic function parameters were normal. - Ventricular septum: Septal motion showed paradox. - Aortic valve: Mild regurgitation. - Mitral valve: Mild regurgitation.      Medications: Scheduled Meds:    . aspirin  325 mg Oral Daily  . enoxaparin  40 mg Subcutaneous Q24H  . influenza  inactive virus vaccine  0.5 mL Intramuscular Tomorrow-1000  . rosuvastatin  40 mg Oral q1800  . sodium chloride  3 mL Intravenous Q12H  . sodium chloride  3 mL Intravenous Q12H  . DISCONTD: aspirin  300 mg Rectal Daily  Continuous Infusions:    . sodium chloride 100 mL/hr at 04/05/11 0624   PRN Meds:.sodium chloride, sodium chloride, senna-docusate, sodium chloride, sodium chloride  Assessment/Plan: 1. 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 Continue aspirin 325 mg daily and statins for secondary stroke prevention. Plavix stopped. Neurology following. Inpatient rehabilitation M.D. input appreciated and is an appropriate candidate for inpatient rehabilitation pending insurance clearance. 2. Hypertension: Allow permissive hypertension for a day or 2. Improved compared to yesterday. Currently not on  medications. 3. Hyperlipidemia: Continue Crestor. 4. History of coronary artery disease status post CABG: Continue aspirin 5. Medication noncompliance: Compliance counseled 6. Microcytosis: ? secondary to hemoglobinopathy. Hemoglobin is normal. 7. Dysphagia secondary to CVA: Dysphagia 3 diet per speech therapy recommendations.  Disposition: Pending neurology followup and insurance clearance for inpatient rehabilitation.    Edward Choi 04/05/2011, 9:39 AM

## 2011-04-05 NOTE — Progress Notes (Signed)
Speech Language Pathology Speech Language Pathology Swallow Treatment Patient Details Name: Edward Choi MRN: [id number] DOB: 1950/11/02 Today's Date: 04/05/2011  SLP Assessment/Plan/Recommendation Assessment Clinical Impression Statement: Pt obsesrved with thin liquids.  No overt s/s aspiration observed or reported by patient. Straws noted at bedside despite precautions posted at [facility] for NO STRAWS.  Straws removed, pt educated regarding swallow safety and recommendation for no straw use with liquids.  Pt verbalized understanding. SLP Recommendation/Assessment: Patient will need skilled Speech Lanaguage Pathology Services in the acute care venue to address identified deficits Problem List: Other (comment) (monitoring swallow safety necess due to location of infarct) Therapy Diagnosis: Other (comment) (dysphagia) Plan Treatment/Interventions: SLP instruction and feedback;Patient/family education;Internal/external aids;Environmental controls Potential to Achieve Goals: Good Potential Considerations: Family/community support SLP Recommendations Equipment Recommended: None recommended by SLP Individuals Consulted Consulted and Agree with Results and Recommendations: Patient  Swallowing Goals  SLP Swallowing Goals Patient will consume recommended diet without observed clinical signs of aspiration with: Minimal assistance Swallow Study Goal #1 - Progress: Progressing toward goal Patient will utilize recommended strategies during swallow to increase swallowing safety with: Minimal assistance Swallow Study Goal #2 - Progress: Progressing toward goal  General Patient observed directly with PO's: Yes Type of PO's observed: Thin liquids Feeding: Able to feed self Liquids provided via: Cup (NO STRAWS!)  Oral Cavity - Oral Hygiene   Adequate  Assessment of Diet Tolerance Clinical Impression Statement: Pt obsesrved with thin liquids.  No overt s/s aspiration observed or reported by  patient. Straws noted at bedside despite precautions posted at [facility] for NO STRAWS.  Straws removed, pt educated regarding swallow safety and recommendation for no straw use with liquids.  Pt verbalized understanding. Supervision/Assistance required: Yes (Pt unable to use right (dominant) hand; setup assist needed) Type of cueing: Verbal Amount of cueing: Minimal Recommendations: Continue current diet  Celia B. Murvin [facility], CCC-SLP [phone] pager

## 2011-04-05 NOTE — Progress Notes (Signed)
Physical Therapy Treatment Patient Details Name: Edward Choi MRN: [id number] DOB: May 05, 1950 Today's Date: 04/05/2011  PT Assessment/Plan  PT - Assessment/Plan Comments on Treatment Session: Focus of treatment session of initiation of right LE activation and sustaining activation in weight bearing position. Sit <-> stand x 5 with manual facilitation at trunk and right LE to improve functional alignment for midline weight bearing. In standing, lateral weight shifts progressing to stepping forward and backward with left LE, manual facilitation at right LE and trunk for sustained right stance components, increased right hip extension and trunk elongation, and anterior lateral weight shifts with mod assist. Patient was pleased with progress today, stating that he could "feel his leg working" in standing. Continue POC. PT Plan: Discharge plan remains appropriate (CIR) PT Goals  Acute Rehab PT Goals PT Goal: Supine/Side to Sit - Progress: Progressing toward goal PT Goal: Sit at [facility] Of Bed - Progress: Progressing toward goal PT Goal: Sit to Supine/Side - Progress: Progressing toward goal PT Goal: Sit to Stand - Progress: Progressing toward goal PT Goal: Stand to Sit - Progress: Met PT Transfer Goal: Bed to Chair/Chair to Bed - Progress: Progressing toward goal PT Goal: Stand - Progress: Progressing toward goal PT Goal: Ambulate - Progress: Progressing toward goal  PT Treatment Precautions/Restrictions  Precautions Precautions: Fall Precaution Comments: blurry vision, right hemiplegia Required Braces or Orthoses: No Restrictions Weight Bearing Restrictions: No Mobility (including Balance) Bed Mobility Rolling Right: 4: Min assist;With rail Rolling Right Details (indicate cue type and reason): decreased active trunkrotation noted Right Sidelying to Sit: 3: Mod assist;HOB flat;With rails Right Sidelying to Sit Details (indicate cue type and reason): decreased active trunk initiation and  weight shift noted Sitting - Scoot to Edge of Bed: 3: Mod assist Sitting - Scoot to Edge of Bed Details (indicate cue type and reason): decreased sequencing of functional task, likely due to mild motor apraxia, decreased dissociation and initiation at pelvis for weight shifts Transfers Sit to Stand: 3: Mod assist;With upper extremity assist;From bed Sit to Stand Details (indicate cue type and reason): manual facilitation at right LE and trunk to increase initiation of anterior weight shift at pelvis and maintain midline weight bearing for forced use of right LE  Stand to Sit: 4: Min assist;With upper extremity assist;To bed;To chair/3-in-1 Stand to Sit Details: manual facilitation at right LE and trunk to increase initiation of anterior weight shift at pelvis and maintain midline weight bearing for forced use of right LE  Stand Pivot Transfers: 3: Mod assist Stand Pivot Transfer Details (indicate cue type and reason): improved control of lowering to sit this afternoon Ambulation/Gait Ambulation/Gait: No Wheelchair Mobility Wheelchair Mobility: No      End of Session PT - End of Session Activity Tolerance: Patient tolerated treatment well Patient left: in chair;with call bell in reach General Behavior During Session: [facility] for tasks performed Cognition: [facility] for tasks performed  [name] 04/05/2011, 3:09 PM

## 2011-04-05 NOTE — Progress Notes (Signed)
Utilization review completed. Edward Choi 04/05/2011 

## 2011-04-06 ENCOUNTER — Inpatient Hospital Stay (HOSPITAL_COMMUNITY)
Admission: RE | Admit: 2011-04-06 | Discharge: 2011-04-27 | DRG: 462 | Disposition: A | Discharge status: Home Health | Payer: BC Managed Care – PPO | Source: Ambulatory Visit | Attending: Physical Medicine & Rehabilitation | Admitting: Physical Medicine & Rehabilitation

## 2011-04-06 DIAGNOSIS — I639 Cerebral infarction, unspecified: Secondary | ICD-10-CM

## 2011-04-06 DIAGNOSIS — I2510 Atherosclerotic heart disease of native coronary artery without angina pectoris: Secondary | ICD-10-CM | POA: Diagnosis present

## 2011-04-06 DIAGNOSIS — Z9119 Patient's noncompliance with other medical treatment and regimen: Secondary | ICD-10-CM

## 2011-04-06 DIAGNOSIS — G8190 Hemiplegia, unspecified affecting unspecified side: Secondary | ICD-10-CM | POA: Diagnosis present

## 2011-04-06 DIAGNOSIS — G8100 Flaccid hemiplegia affecting unspecified side: Secondary | ICD-10-CM

## 2011-04-06 DIAGNOSIS — Z91199 Patient's noncompliance with other medical treatment and regimen due to unspecified reason: Secondary | ICD-10-CM

## 2011-04-06 DIAGNOSIS — I10 Essential (primary) hypertension: Secondary | ICD-10-CM | POA: Diagnosis present

## 2011-04-06 DIAGNOSIS — E785 Hyperlipidemia, unspecified: Secondary | ICD-10-CM | POA: Diagnosis present

## 2011-04-06 DIAGNOSIS — I6350 Cerebral infarction due to unspecified occlusion or stenosis of unspecified cerebral artery: Secondary | ICD-10-CM | POA: Diagnosis present

## 2011-04-06 DIAGNOSIS — Z5189 Encounter for other specified aftercare: Secondary | ICD-10-CM

## 2011-04-06 DIAGNOSIS — R1310 Dysphagia, unspecified: Secondary | ICD-10-CM | POA: Diagnosis present

## 2011-04-06 DIAGNOSIS — I6330 Cerebral infarction due to thrombosis of unspecified cerebral artery: Secondary | ICD-10-CM

## 2011-04-06 DIAGNOSIS — Z951 Presence of aortocoronary bypass graft: Secondary | ICD-10-CM

## 2011-04-06 LAB — MRSA PCR SCREENING: MRSA by PCR: NEGATIVE

## 2011-04-06 MED ORDER — POLYETHYLENE GLYCOL 3350 17 G PO PACK
17.0000 g | PACK | Freq: Every day | ORAL | Status: DC | PRN
  Filled 2011-04-06: qty 1

## 2011-04-06 MED ORDER — ASPIRIN 325 MG PO TABS
325.0000 mg | ORAL_TABLET | Freq: Every day | ORAL | Status: DC
  Administered 2011-04-07 – 2011-04-27 (×21): 325 mg via ORAL
  Filled 2011-04-06 (×22): qty 1

## 2011-04-06 MED ORDER — ACETAMINOPHEN 325 MG PO TABS: 325.0000 mg | ORAL_TABLET | ORAL | Status: DC | PRN

## 2011-04-06 MED ORDER — ASPIRIN EC 325 MG PO TBEC: 325.0000 mg | DELAYED_RELEASE_TABLET | Freq: Every day | ORAL | Status: AC

## 2011-04-06 MED ORDER — SENNA 8.6 MG PO TABS
1.0000 | ORAL_TABLET | Freq: Two times a day (BID) | ORAL | Status: DC
  Administered 2011-04-06 – 2011-04-12 (×12): 8.6 mg via ORAL
  Filled 2011-04-06 (×28): qty 1

## 2011-04-06 MED ORDER — SORBITOL 70 % SOLN: 30.0000 mL | Freq: Every day | Status: DC | PRN

## 2011-04-06 MED ORDER — ENOXAPARIN SODIUM 40 MG/0.4ML ~~LOC~~ SOLN
40.0000 mg | SUBCUTANEOUS | Status: DC
  Administered 2011-04-07 – 2011-04-26 (×20): 40 mg via SUBCUTANEOUS
  Filled 2011-04-06 (×21): qty 0.4

## 2011-04-06 MED ORDER — PROMETHAZINE HCL 12.5 MG PO TABS: 12.5000 mg | ORAL_TABLET | Freq: Four times a day (QID) | ORAL | Status: DC | PRN

## 2011-04-06 MED ORDER — PROMETHAZINE HCL 25 MG/ML IJ SOLN: 12.5000 mg | Freq: Four times a day (QID) | INTRAMUSCULAR | Status: DC | PRN

## 2011-04-06 MED ORDER — ROSUVASTATIN CALCIUM 40 MG PO TABS: 40.0000 mg | ORAL_TABLET | Freq: Every day | ORAL | Status: DC

## 2011-04-06 MED ORDER — ROSUVASTATIN CALCIUM 40 MG PO TABS
40.0000 mg | ORAL_TABLET | Freq: Every day | ORAL | Status: DC
  Administered 2011-04-06 – 2011-04-26 (×21): 40 mg via ORAL
  Filled 2011-04-06 (×22): qty 1

## 2011-04-06 MED ORDER — PROMETHAZINE HCL 12.5 MG RE SUPP: 12.5000 mg | Freq: Four times a day (QID) | RECTAL | Status: DC | PRN

## 2011-04-06 NOTE — Progress Notes (Signed)
Rehab admissions - I called and faxed information to [facility].  I am waiting on an OT update.  Hope to know something later this morning.  Pager [phone]

## 2011-04-06 NOTE — Progress Notes (Signed)
Occupational Therapy Treatment Patient Details Name: Edward Choi MRN: [id number] DOB: Dec 29, 1950 Today's Date: 04/06/2011  OT Assessment/Plan OT Assessment/Plan Comments on Treatment Session: Pt remains excellent rehab candidate. OT Plan: Discharge plan remains appropriate OT Frequency: Min 2X/week Recommendations for Other Services: Rehab consult Follow Up Recommendations: Inpatient Rehab Equipment Recommended: Defer to next venue OT Goals Acute Rehab OT Goals OT Goal Formulation: With patient/family Time For Goal Achievement: 2 weeks ADL Goals Pt Will Perform Grooming: with supervision;Standing at sink;Supported ADL Goal: Grooming - Progress: Progressing toward goals Pt Will Perform Upper Body Bathing: with supervision;Sitting, chair ADL Goal: Upper Body Bathing - Progress: Progressing toward goals Pt Will Transfer to Toilet: with min assist;Ambulation;3-in-1 ADL Goal: Toilet Transfer - Progress: Progressing toward goals Pt Will Perform Toileting - Clothing Manipulation: with min assist;Sitting on 3-in-1 or toilet ADL Goal: Toileting - Clothing Manipulation - Progress: Progressing toward goals Pt Will Perform Toileting - Hygiene: with supervision;Sit to stand from 3-in-1/toilet ADL Goal: Toileting - Hygiene - Progress: Progressing toward goals Miscellaneous OT Goals Miscellaneous OT Goal #1: Pt will perform bed mobility Supervision level as precursor to ADLS  OT Treatment Precautions/Restrictions  Precautions Precautions: Fall Precaution Comments: blurry vision, right hemiplegia Required Braces or Orthoses: No Restrictions Weight Bearing Restrictions: No   ADL ADL Eating/Feeding: Performed;Minimal assistance Where Assessed - Eating/Feeding: Chair (supported) Grooming: Performed;Teeth care;Moderate assistance Where Assessed - Grooming: Supported (Standing with bedside tray in front of pt) Upper Body Bathing: Performed;Chest;Left arm;Abdomen;Moderate  assistance Upper Body Bathing Details (indicate cue type and reason): Pt provided bath sitting on 3N1 pt reports not having a bath since arrival. Pt required assistance bathing Lt underarm and Lt arm Where Assessed - Upper Body Bathing: Other (comment) (3N1 sitting) Lower Body Bathing: Performed;Moderate assistance Lower Body Bathing Details (indicate cue type and reason): Pt required (A) for posterior peri hygiene and support to perform front peri hygiene with Lt UE. Pt able to reach Lt ankle and Rt LE lower calf area with bathing. pt required (A) with washing feet. Where Assessed - Lower Body Bathing: Other (comment) (sit<>stand 3N1, peri hygiene performed standing) Upper Body Dressing: Performed;Minimal assistance Upper Body Dressing Details (indicate cue type and reason): pt using Lt Ue to lift Rt UE at wrist to assist with dressing Where Assessed - Upper Body Dressing: Other (comment) (sitting on 3N1) Lower Body Dressing: Performed;Maximal assistance Where Assessed - Lower Body Dressing: Other (comment);Supported (sitting 3N1) Toilet Transfer: Teacher, early years/pre Details (indicate cue type and reason): pt 80% of transfer. Pt with LT UE hand held support. Pt requires assistance shifting weight but is advancing Rt LE with decrease coordination  Toilet Transfer Method: Ambulating Toilet Transfer Equipment: Raised toilet seat with arms (or 3-in-1 over toilet) Toileting - Clothing Manipulation: Performed;Maximal assistance Where Assessed - Toileting Clothing Manipulation: Sit to stand from 3-in-1 or toilet Toileting - Hygiene: Performed;Moderate assistance Where Assessed - Toileting Hygiene: Sit to stand from 3-in-1 or toilet Equipment Used: Other (comment) (hand held (A)) Ambulation Related to ADLs: Pt sit<>stand from chair with mod v/c for hand placement. Pt sit<>stand with Mod (A). Pt weight bearing on Rt LE. Pt static standing with posterior lean and with cueing self  correcting to min A level static standing. Pt provided bedside tray with tooth brush and toothpaste applied . Pt weight bearing through Rt UE ADL Comments: Pt taking extra time and attending to details. pt with wet pants on arrival and verbalizes to therapist. Pt required Mod (A) to doff pants. Pt voiding  bowel and bladder in 3N1. Pt demontrates Brunstrom I (flaccid) Rt UE during session. Mobility  Bed Mobility Bed Mobility: No (upright in chair on arrival) Transfers Transfers: Yes Sit to Stand: 3: Mod assist;With upper extremity assist;From bed Sit to Stand Details (indicate cue type and reason): pt requires facilitation at hips for lateral weight shift to assist with advancing Rt LE with gait sequence and for posterior lean. Stand to Sit: 4: Min assist;With upper extremity assist;To chair/3-in-1 Exercises    End of Session OT - End of Session Equipment Utilized During Treatment: Gait belt Activity Tolerance: Patient tolerated treatment well Patient left: in chair;with call bell in reach Nurse Communication: Mobility status for transfers General Behavior During Session: [facility] for tasks performed Cognition: [facility] for tasks performed  Lucile Shutters  04/06/2011, 9:11 AM Pager: [phone]

## 2011-04-06 NOTE — Progress Notes (Signed)
Rehab admissions - I have precert from [facility] for admit to inpatient rehab today.  I have spoken with patient's wife as well.  I have a rehab bed open today and can admit today if okay with MD.  Pager [phone]

## 2011-04-06 NOTE — Progress Notes (Signed)
Pt given stroke education with "[facility]" booklet. Pt D/C'd to rehab via wheelchair @ 1545 per MD order. Report called to rehab RN. Rema Fendt, RN

## 2011-04-06 NOTE — Progress Notes (Signed)
Patient admitted to rehab, no family present. Patient oriented to room, bed controls, call light, rehab routine, white board, sttoke education booklet, safety plan , video, pain goal, lead nurse, preferred name and his goal, posted at HOB,therpay schedule, meals, team conference. Question if patient has some dysarthria/aphasia or is he difficult to understand due to this accent? He can make his needs known. Please see [facility] for details of head to toe assessment, will continue to monitor. Edward Choi, Edward Choi

## 2011-04-06 NOTE — Discharge Summary (Signed)
Discharge Summary  Edward Choi MR#: [id number]  DOB:Nov 06, 1950  Date of Admission: 04/03/2011 Date of Discharge: 04/06/2011  Patient's PCP: Nonnie Done., MD, MD  Attending Physician:HONGALGI,ANAND  Consults: Neurology: Delia Heady, M.D   Discharge Diagnoses: 1. Left paracentral pontine infarct/CVA, with right hemiparesis 2. Hyperlipidemia 3. Hypertension 4. History of coronary artery disease status post CABG 5. History of depression    Brief Admitting History and Physical Edward Choi is an 61 y.o. male presented to the ER with right hemiparesis. The patient does not speak fluent English so the history and physical were conducted with the use of [place] translation via Radio producer. The patient apparently woke up 04/03/2011 morning around 5am with right sided weakness. His wife had already left for work so he lay in bed all day with continued weakness on the right. Around 5pm, his family was there and he fell out of bed trying to get out. The family sat him in a chair and then called EMS. The patient had trouble with his speech. He denied any numbness or tingling. He denied any problems with swallowing or vision. The patient had a slight headache but denied dizzyness/vertigo/lightheadedness. He did not have nausea nor did he vomit. The patient has never had this happen before. He underwent a CABG in 2007 (?) and gave history of high blood pressure and cholesterol but is not taking any medications at all. He was not a tPA candidate as he presented beyond the treatment window. He was admitted for further evaluation and treatment.   Discharge Medications Current Discharge Medication List    START taking these medications   Details  aspirin EC 325 MG tablet Take 1 tablet (325 mg total) by mouth daily.    rosuvastatin (CRESTOR) 40 MG tablet Take 1 tablet (40 mg total) by mouth daily at 6 PM. Qty: 30 tablet, Refills: 0        Hospital Course: 1. Left paracentral  pontine infarct/CVA, with right hemiparesis, secondary to small vessel disease: Neurology and speech therapy were consulted. Stroke workup was completed. Patient is to continue aspirin 325 mg daily for secondary stroke prevention. He had initially been loaded with Plavix but this was discontinued. He had not been on aspirin before. Speech therapy recommends dysphagia 3 diet with thin liquids. He was started on statins. Inpatient rehabilitation M.D. consulted and he is appropriate inpatient rehabilitation. His strength in the right limbs is slowly improving. 2. Hypertension: Reasonable blood pressures at this time but may consider starting him on some antihypertensive medications for tighter control in the next couple of days. 3. Hyperlipidemia: Started on statins. Followup as outpatient. Patient denies any muscle pains. His CK is mildly elevated and recommend repeating his muscle enzymes and LFTs in a few days or if patient develops symptoms of muscle pain and weakness. 4. History of coronary artery disease, status post CABG: Continue aspirin. Patient denied any chest pains. 5. Medication noncompliance: Compliance has been counseled. 6. Microcytosis:? secondary to hemoglobinopathy. Hemoglobin is normal. Outpatient evaluation as deemed necessary.   Day of Discharge BP 129/87  Pulse 78  Temp(Src) 98.5 F (36.9 C) (Oral)  Resp 16  Ht 5\' 2"  (1.575 m)  Wt 58.2 kg (128 lb 4.9 oz)  BMI 23.47 kg/m2  SpO2 94%  General Exam: Comfortable. Sitting up in a chair.  Respiratory System: Clear. No increased work of breathing.  Cardiovascular System: First and second heart sounds heard. Regular rate and rhythm. No JVD/murmurs. Telemetry shows sinus rhythm.  Gastrointestinal System: Abdomen is non  distended, soft and normal bowel sounds heard.  Central Nervous System: Alert and oriented. Mild right lower facial weakness.  Extremities: Grade 2 x 5 in right upper extremity and 3 x 5 in right lower  extremity   Results for orders placed during the hospital encounter of 04/03/11 (from the past 48 hour(s))  CARDIAC PANEL(CRET KIN+CKTOT+MB+TROPI)     Status: Abnormal   Collection Time   04/04/11  3:24 PM      Component Value Range Comment   Total CK 447 (*) 7 - 232 (U/L)    CK, MB 2.2  0.3 - 4.0 (ng/mL)    Troponin I <0.30  <0.30 (ng/mL)    Relative Index 0.5  0.0 - 2.5     Lab data: 1. BMP was within normal limits. 2. LFTs within normal limits 3. Cardiac enzymes showed negative troponins and mildly elevated CK ranging from 357-512 mg/dL 4. Lipid panel shows cholesterol 232, triglyceride 111, HDL 37, LDL 173 and VLDL 22 5. CBCs within normal limit except MCV 59.5 , MCH 19.6 and RDW 19.2. 6. Hemoglobin A1c of 5.5 and 5.6. 7. TSH 1.605 8. Urine drug screen was negative   Studies/Results:   Ct Head Wo Contrast   04/03/2011 *RADIOLOGY REPORT* Clinical Data: Right side weakness. CT HEAD WITHOUT CONTRAST Technique: Contiguous axial images were obtained from the base of the skull through the vertex without contrast. Comparison: None. Findings: There is no evidence of acute intracranial abnormality including acute infarction, hemorrhage, mass lesion, mass effect, midline shift or abnormal excessive fluid collection. No hydrocephalus or pneumocephalus. Mild chronic microvascular ischemic change noted. Calvarium intact. IMPRESSION: No acute finding. Original Report Authenticated By: Bernadene Bell. Maricela Curet, M.D.    Mr Maxine Glenn Head Wo Contrast   04/04/2011 *RADIOLOGY REPORT* Clinical Data: Right-sided weakness. MRI HEAD WITHOUT CONTRAST MRA HEAD WITHOUT CONTRAST Technique: Multiplanar, multiecho pulse sequences of the brain and surrounding structures were obtained according to standard protocol without intravenous contrast. Angiographic images of the head were obtained using MRA technique without contrast. Comparison: 04/03/2011 CT. MRI HEAD Findings: Left paracentral pontine acute non hemorrhagic  infarct. Moderate nonspecific white matter type changes probably related to result of small vessel disease given the present findings. No intracranial hemorrhage. Global atrophy without hydrocephalus. No intracranial mass lesion detected on this unenhanced exam. Partial opacification left mastoid air cells without obstructing lesion noted. Mild mucosal thickening ethmoid sinus air cells. Minimal anterior slip C2 with mild spinal stenosis. IMPRESSION: Left paracentral pontine acute non hemorrhagic infarct. Moderate nonspecific white matter type changes probably related to result of small vessel disease. Global atrophy without hydrocephalus. Partial opacification left mastoid air cells without obstructing lesion noted. Mild mucosal thickening ethmoid sinus air cells. MRA HEAD Findings: Mild narrowing proximal A1 segment right anterior cerebral artery. Mild narrowing right middle cerebral artery bifurcation and proximal right middle cerebral artery branch vessels. Otherwise anterior circulation without medium or large size vessel significant stenosis or occlusion. Codominant vertebral arteries appear ectatic with mild narrowing of the distal left vertebral artery and mild narrowing of the right vertebral artery at the takeoff of the right PICA. Irregularity with mild narrowing mid and distal aspect of the basilar artery. Nonvisualization right AICA with diminutive size of left AICA. Mild irregularity superior cerebellar arteries bilaterally. Posterior cerebral artery distal branch vessel irregularity. No aneurysm noted. IMPRESSION: Mild intracranial atherosclerotic type changes as noted above. Original Report Authenticated By: Fuller Canada, M.D.    Dg Chest Portable 1 View  04/03/2011 *RADIOLOGY REPORT* Clinical Data:  Acute right-sided weakness. PORTABLE CHEST - 1 VIEW Comparison: None. Findings: Heart size and pulmonary vascularity are normal and the lungs are clear. Markings are slightly accentuated due to a  shallow inspiration. Prior CABG. IMPRESSION: No acute abnormalities. Original Report Authenticated By: Gwynn Burly, M.D.   Carotid Doppler Bilaterally no significant ICA stenosis with antegrade vertebral flow   2-D echo:  Study Conclusions:  - Left ventricle: The cavity size was normal. Systolic function was normal. The estimated ejection fraction was in the range of 55% to 60%. Wall motion was normal; there were no regional wall motion abnormalities. Left ventricular diastolic function parameters were normal. - Ventricular septum: Septal motion showed paradox. - Aortic valve: Mild regurgitation. - Mitral valve: Mild regurgitation.     Disposition: Discharge to comprehensive inpatient rehabilitation, in stable condition  Diet:  dysphagia 3 diet with thin liquids.  Activity:  increase activity gradually  Follow-up Appts: Discharge Orders    Future Orders Please Complete By Expires   Increase activity slowly      Discharge instructions      Comments:   Dysphagia 3 diet with thin liquids. Please refer to detailed recommendations by speech therapy as far as diet is concerned.      TESTS THAT NEED FOLLOW-UP  LFTs and CK in 5-7 days from hospital discharge   Time spent on discharge, talking to the patient, and coordinating care: .   SignedMarcellus Scott, MD 04/06/2011, 10:31 AM

## 2011-04-06 NOTE — H&P (Signed)
Physical Medicine and Rehabilitation Admission H&P  Aldin Drees is an 61 y.o. male.   No chief complaint on file. : HPI: 61 year old right-handed [place] male with history of coronary artery disease with bypass grafting in 2006. Patient on no medications prior to hospital admission with questionable medical compliance. Patient independent prior to admission. Admitted January 21 with right-sided weakness. 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 MRA of the head with mild intracranial atherosclerotic type changes. Patient did not receive TPA. Echocardiogram with ejection fraction of 60% without emboli . Carotid Dopplers with no significant extracranial carotid artery stenosis. Neurology services consulted placed on aspirin therapy as well as subcutaneous Lovenox. Physical therapy evaluation January 23 as patient was moderate assist for sit to stand and stand to sit. Moderate assist for stand pivot transfers. Require +1 total assist to ambulate 3 feet. Occupational therapy moderate assist upper body bathing as well as lower body bathing. Minimal assist upper body dressing and maximal assistance lower body dressing. Speech therapy bedside swallowing evaluation maintained on mechanical soft diet with thin liquids. Physical medicine and rehabilitation was consulted for inpatient rehabilitation services.  Review of Systems  Constitutional: Negative for malaise/fatigue.  Eyes: Negative for double vision.  Respiratory: Negative for cough and shortness of breath.  Cardiovascular: Negative for chest pain.  Gastrointestinal: Negative for nausea.  Genitourinary: Negative.  Musculoskeletal: Negative for myalgias.  Neurological: Negative for dizziness and headaches.  Psychiatric/Behavioral: Negative   Past Medical History  Diagnosis Date  . Hypertension   . Acute MI   . Coronary artery disease   . Depression   . Back pain    Past Surgical History  Procedure  Date  . Coronary artery bypass graft 2006   No family history on file. Social History:  reports that he has never smoked. He has never used smokeless tobacco. He reports that he does not drink alcohol or use illicit drugs. Allergies: No Known Allergies Medications Prior to Admission  Medication Dose Route Frequency Provider Last Rate Last Dose  . acetaminophen (TYLENOL) tablet 325-650 mg  325-650 mg Oral Q4H PRN Mcarthur Rossetti. Angiulli, PA      . aspirin tablet 325 mg  325 mg Oral Daily Mcarthur Rossetti. Angiulli, PA      . enoxaparin (LOVENOX) injection 40 mg  40 mg Subcutaneous Q24H Mcarthur Rossetti. Angiulli, PA      . polyethylene glycol (MIRALAX / GLYCOLAX) packet 17 g  17 g Oral Daily PRN Mcarthur Rossetti. Angiulli, PA      . promethazine (PHENERGAN) tablet 12.5 mg  12.5 mg Oral Q6H PRN Mcarthur Rossetti. Angiulli, PA       Or  . promethazine (PHENERGAN) suppository 12.5 mg  12.5 mg Rectal Q6H PRN Mcarthur Rossetti. Angiulli, PA       Or  . promethazine (PHENERGAN) injection 12.5 mg  12.5 mg Intramuscular Q6H PRN Mcarthur Rossetti. Angiulli, PA      . rosuvastatin (CRESTOR) tablet 40 mg  40 mg Oral q1800 Mcarthur Rossetti. Angiulli, PA      . senna (SENOKOT) tablet 8.6 mg  1 tablet Oral BID Mcarthur Rossetti. Angiulli, PA      . sorbitol 70 % solution 30 mL  30 mL Oral Daily PRN Mcarthur Rossetti. Angiulli, PA      . DISCONTD: 0.9 %  sodium chloride infusion   Intravenous Continuous Toy Baker, MD 100 mL/hr at 04/05/11 [phone]    . DISCONTD: 0.9 %  sodium chloride infusion  250 mL Intravenous PRN Toy Baker, MD      . DISCONTD: 0.9 %  sodium chloride infusion  250 mL Intravenous PRN Nishant Dhungel, MD      . DISCONTD: aspirin tablet 325 mg  325 mg Oral Daily Nishant Dhungel, MD   325 mg at 04/06/11 0813  . DISCONTD: enoxaparin (LOVENOX) injection 40 mg  40 mg Subcutaneous Q24H Nishant Dhungel, MD   40 mg at 04/06/11 0936  . DISCONTD: rosuvastatin (CRESTOR) tablet 40 mg  40 mg Oral q1800 Nishant Dhungel, MD   40 mg at 04/05/11 1715  . DISCONTD: senna-docusate  (Senokot-S) tablet 1 tablet  1 tablet Oral QHS PRN Nishant Dhungel, MD      . DISCONTD: sodium chloride 0.9 % injection 3 mL  3 mL Intravenous Q12H Toy Baker, MD   3 mL at 04/06/11 0936  . DISCONTD: sodium chloride 0.9 % injection 3 mL  3 mL Intravenous PRN Toy Baker, MD      . DISCONTD: sodium chloride 0.9 % injection 3 mL  3 mL Intravenous Q12H Nishant Dhungel, MD   3 mL at 04/06/11 0936  . DISCONTD: sodium chloride 0.9 % injection 3 mL  3 mL Intravenous PRN Nishant Dhungel, MD       Medications Prior to Admission  Medication Sig Dispense Refill  . aspirin EC 325 MG tablet Take 1 tablet (325 mg total) by mouth daily.      . rosuvastatin (CRESTOR) 40 MG tablet Take 1 tablet (40 mg total) by mouth daily at 6 PM.  30 tablet  0    Home:     Functional History:    Functional Status:  Mobility:          ADL:    Cognition:       Blood pressure 130/76, pulse 82, temperature 98.6 F (37 C), temperature source Oral, resp. rate 18, SpO2 96.00%. Constitutional:  Family member at bedside to assist with exam due to language barrier with patient  HENT:  Head: Normocephalic.  Neck: Normal range of motion. No thyromegaly present.  Cardiovascular: Normal rate and regular rhythm.  Pulmonary/Chest: Breath sounds normal. He has no wheezes.  Abdominal: He exhibits no distension. There is no tenderness.  Musculoskeletal: He exhibits no edema.  Neurological: He is alert.  Follows basic two-step motor commands. Answers simple questions. There is a language barrier but patient can communicate his needs. Patient with 1/5 strength intermittently throughout the right upper extremity. He is perhaps 1-1+ out of 5 right lower extremity with some motor planning issues. Is a right central 7 and tongue deviation. Speech is slightly dysarthric. Sensation is  right arm and leg Mildly diminished. Reflexes are 1+ throughout.  Skin: Skin is warm and dry.  Psychiatric: He has a normal mood and  affect CN central 7 on Right  No results found for this or any previous visit (from the past 48 hour(s)). No results found.  Post Admission Physician Evaluation: 1. Functional deficits secondary  to L pontine paramedian infarct with R spastic hemiparesis. 2. Patient is admitted to receive collaborative, interdisciplinary care between the physiatrist, rehab nursing staff, and therapy team. 3. Patient's level of medical complexity and substantial therapy needs in context of that medical necessity cannot be provided at a lesser intensity of care such as a SNF. 4. Patient has experienced substantial functional loss from his/her baseline which was documented above under the "Functional History" and "Functional Status" headings.  Judging by the patient's diagnosis,  physical exam, and functional history, the patient has potential for functional progress which will result in measurable gains while on inpatient rehab.  These gains will be of substantial and practical use upon discharge  in facilitating mobility and self-care at the household level. 5. Physiatrist will provide 24 hour management of medical needs as well as oversight of the therapy plan/treatment and provide guidance as appropriate regarding the interaction of the two. 6. 24 hour rehab nursing will assist with bladder management, bowel management, safety, skin/wound care, disease management, medication administration and patient education  and help integrate therapy concepts, techniques,education, etc. 7. PT will assess and treat for:  Pre gait, WC mob, gait training, safety, equipment.  Goals are: Min A mobility. 8. OT will assess and treat for: ADL, cog/perceptual, safety, endurance ,equipment.   Goals are: Min A ADL. 9. SLP will assess and treat for: cognition.  Goals are: Cogniton adequate for home environment. 10. Case Management and Social Worker will assess and treat for psychological issues and discharge planning. 11. Team conference  will be held weekly to assess progress toward goals and to determine barriers to discharge. 12.  Patient will receive at least 3 hours of therapy per day at least 5 days per week. 13. ELOS and Prognosis: 3 wk good   Medical Problem List and Plan: 1. left paracentral pontine infarction. Continue aspirin therapy 2. DVT Prophylaxis/Anticoagulation: Subcutaneous Lovenox. Monitor platelet count and any bleeding episodes 3. Dysphagia. Mechanical soft diet thin liquids. Monitor for any signs of aspiration. Speech therapy to follow up 4. Hyperlipidemia. Crestor 5. Hypertension. Currently on no antihypertensive medications. Will monitor the increased activity 6. Coronary artery disease. CABG 06. Continue aspirin 7. Medical noncompliance. Provide counseling Erick Colace 04/06/2011, 5:05 PM

## 2011-04-06 NOTE — PMR Pre-admission (Signed)
PMR Admission Coordinator Pre-Admission Assessment  Patient:  Edward Choi is an 61 y.o., male MRN:  [id number] DOB:  05/20/1950 Height:  5\' 2"  (157.5 cm) Weight:  58.2 kg (128 lb 4.9 oz)  Insurance Information:  Precert x 12 days.  Update due 04/17/11 HMO:     PPO:      PCP:      IPA:      80/20:      OTHER: Group # [id number] PRIMARY:[place] [place]      Policy#:[id number]      Subscriber:Edward Choi CM Name:Linda      Phone#:[phone]     AOZ#:[phone] Pre-Cert#:[id number]      Employer:Fulltime Benefits:  Phone #[phone]     Name:Amber Eff. Date:03/14/11 Deduct:$2000/$4000 Out of Pocket Max:$2000/$4000 Life GMW:[id number] CIR:100% w/precert      SNF:100 % w/precert 120 days Outpatient:100% 30 visits combined     Co-Pay:none Home Health:100%      Co-Pay:none DME:100% w/auth > $500     Co-Pay:none Providers:in network  Patient Admitting Diagnosis: Left paracentral pontine infarct  History of Present Illness: Admitted 01/21 with right sided weakness.  MRI showed left paracentral pontine acute nonhemorrhagic infarction.  Did not receive TPA.    Patients Past Medical History:   Past Medical History  Diagnosis Date  . Hypertension   . Acute MI   . Coronary artery disease   . Depression   . Back pain    Family Medical History:  family history is not on file. NIH Stroke scale: Total: 7  Patients Current Diet: Dysphagia  Prior Rehab/Hospitalizations: Had outpatient therapies in [facility] few yrs ago.  Current Medications: Current facility-administered medications:0.9 %  sodium chloride infusion, 250 mL, Intravenous, PRN, Toy Baker, MD;  0.9 %  sodium chloride infusion, 250 mL, Intravenous, PRN, Nishant Dhungel, MD;  aspirin tablet 325 mg, 325 mg, Oral, Daily, Nishant Dhungel, MD, 325 mg at 04/06/11 0813;  enoxaparin (LOVENOX) injection 40 mg, 40 mg, Subcutaneous, Q24H, Nishant Dhungel, MD, 40 mg at 04/05/11 1002 rosuvastatin (CRESTOR) tablet 40 mg, 40 mg, Oral, q1800,  Nishant Dhungel, MD, 40 mg at 04/05/11 1715;  senna-docusate (Senokot-S) tablet 1 tablet, 1 tablet, Oral, QHS PRN, Nishant Dhungel, MD;  sodium chloride 0.9 % injection 3 mL, 3 mL, Intravenous, Q12H, Toy Baker, MD, 3 mL at 04/05/11 2142;  sodium chloride 0.9 % injection 3 mL, 3 mL, Intravenous, PRN, Toy Baker, MD sodium chloride 0.9 % injection 3 mL, 3 mL, Intravenous, Q12H, Nishant Dhungel, MD, 3 mL at 04/04/11 1004;  sodium chloride 0.9 % injection 3 mL, 3 mL, Intravenous, PRN, Nishant Dhungel, MD;  DISCONTD: 0.9 %  sodium chloride infusion, , Intravenous, Continuous, Toy Baker, MD, Last Rate: 100 mL/hr at 04/05/11 4403  Additional Precautions/Restrictions: Precautions Precautions: Fall Precaution Comments: blurry vision, right hemiplegia Required Braces or Orthoses: No Restrictions Weight Bearing Restrictions: No  Therapy Assessments Physical Therapy: Precautions Precautions: Fall Precaution Comments: blurry vision, right hemiplegia Required Braces or Orthoses: No Home Living Lives With: Spouse;Family Receives Help From: Family Type of Home: House Home Layout: One level Home Access: Stairs to enter Entrance Stairs-Rails: Right;Left;Can reach both Entrance Stairs-Number of Steps: 2-3 Bathroom Shower/Tub: Forensic scientist: Standard Bathroom Accessibility: Yes How Accessible: Accessible via walker Home Adaptive Equipment: None Additional Comments: wife is available 24/7; pt and spouse offered translator and declined during evaluation Prior Function Level of Independence: Independent with basic ADLs;Independent with homemaking with ambulation;Independent with gait;Independent with transfers Driving: No Vocation:  Unemployed Coordination Gross Motor Movements are Fluid and Coordinated: No Fine Motor Movements are Fluid and Coordinated: No Coordination and Movement Description: decreased grading of gross motor movements and weight shifts in  all planes Heel Shin Test: WNL left LE, unable to assess right LE due to inadequate strength to achieve full ROM for isolated testing.  Occupational Therapy: Precautions Precautions: Fall Precaution Comments: blurry vision, right hemiplegia Required Braces or Orthoses: No Home Living Lives With: Spouse;Family Receives Help From: Family Type of Home: House Home Layout: One level Home Access: Stairs to enter Entrance Stairs-Rails: Right;Left;Can reach both Entrance Stairs-Number of Steps: 2-3 Bathroom Shower/Tub: Forensic scientist: Standard Bathroom Accessibility: Yes How Accessible: Accessible via walker Home Adaptive Equipment: None Additional Comments: wife is available 24/7; pt and spouse offered translator and declined during evaluation Prior Function Level of Independence: Independent with basic ADLs;Independent with homemaking with ambulation;Independent with gait;Independent with transfers Driving: No Vocation: Unemployed Educational psychologist Movements are Fluid and Coordinated: No Fine Motor Movements are Fluid and Coordinated: No Coordination and Movement Description: decreased grading of gross motor movements and weight shifts in all planes Heel Shin Test: WNL left LE, unable to assess right LE due to inadequate strength to achieve full ROM for isolated testing. Restrictions Weight Bearing Restrictions: No ADL Eating/Feeding: Performed;Minimal assistance Where Assessed - Eating/Feeding: Chair (supported) Grooming: Performed;Teeth care;Moderate assistance Where Assessed - Grooming: Supported (Standing with bedside tray in front of pt) Upper Body Bathing: Performed;Chest;Left arm;Abdomen;Moderate assistance Upper Body Bathing Details (indicate cue type and reason): Pt provided bath sitting on 3N1 pt reports not having a bath since arrival. Pt required assistance bathing Lt underarm and Lt arm Where Assessed - Upper Body Bathing: Other (comment)  (3N1 sitting) Lower Body Bathing: Performed;Moderate assistance Lower Body Bathing Details (indicate cue type and reason): Pt required (A) for posterior peri hygiene and support to perform front peri hygiene with Lt UE. Pt able to reach Lt ankle and Rt LE lower calf area with bathing. pt required (A) with washing feet. Where Assessed - Lower Body Bathing: Other (comment) (sit<>stand 3N1, peri hygiene performed standing) Upper Body Dressing: Performed;Minimal assistance Upper Body Dressing Details (indicate cue type and reason): pt using Lt Ue to lift Rt UE at wrist to assist with dressing Where Assessed - Upper Body Dressing: Other (comment) (sitting on 3N1) Lower Body Dressing: Performed;Maximal assistance Where Assessed - Lower Body Dressing: Other (comment);Supported (sitting 3N1) Toilet Transfer: Teacher, early years/pre Details (indicate cue type and reason): pt 80% of transfer. Pt with LT UE hand held support. Pt requires assistance shifting weight but is advancing Rt LE with decrease coordination  Toilet Transfer Method: Ambulating Toilet Transfer Equipment: Raised toilet seat with arms (or 3-in-1 over toilet) Toileting - Clothing Manipulation: Performed;Maximal assistance Where Assessed - Toileting Clothing Manipulation: Sit to stand from 3-in-1 or toilet Toileting - Hygiene: Performed;Moderate assistance Where Assessed - Toileting Hygiene: Sit to stand from 3-in-1 or toilet Equipment Used: Other (comment) (hand held (A)) Ambulation Related to ADLs: Pt sit<>stand from chair with mod v/c for hand placement. Pt sit<>stand with Mod (A). Pt weight bearing on Rt LE. Pt static standing with posterior lean and with cueing self correcting to min A level static standing. Pt provided bedside tray with tooth brush and toothpaste applied . Pt weight bearing through Rt UE ADL Comments: Pt taking extra time and attending to details. pt with wet pants on arrival and verbalizes to  therapist. Pt required Mod (A) to doff pants.  Pt voiding bowel and bladder in 3N1. Pt demontrates Brunstrom I (flaccid) Rt UE during session.  SLP Recommendations: Recommendations for Other Services: Rehab consult Follow up Recommendations: Inpatient Rehab Equipment Recommended: Defer to next venue Solid Consistency: Dysphagia 3 (Mechanical soft) (chopped meats, extra gravy/sauce) Liquid Consistency: Thin Liquid Administration via: No straw;Cup Medication Administration: Whole meds with puree Supervision: Patient able to self feed;Intermittent supervision to cue for compensatory strategies Compensations: Slow rate Postural Changes and/or Swallow Maneuvers: Seated upright 90 degrees Oral Care Recommendations: Oral care BID Recommendations for Other Services: Rehab consult Follow up Recommendations: Inpatient Rehab  Prior Function: Level of Independence: Independent with basic ADLs;Independent with homemaking with ambulation;Independent with gait;Independent with transfers Driving: No Vocation: Unemployed ADL Eating/Feeding: Performed;Minimal assistance Where Assessed - Eating/Feeding: Chair (supported) Grooming: Performed;Teeth care;Moderate assistance Where Assessed - Grooming: Supported (Standing with bedside tray in front of pt) Upper Body Bathing: Performed;Chest;Left arm;Abdomen;Moderate assistance Upper Body Bathing Details (indicate cue type and reason): Pt provided bath sitting on 3N1 pt reports not having a bath since arrival. Pt required assistance bathing Lt underarm and Lt arm Where Assessed - Upper Body Bathing: Other (comment) (3N1 sitting) Lower Body Bathing: Performed;Moderate assistance Lower Body Bathing Details (indicate cue type and reason): Pt required (A) for posterior peri hygiene and support to perform front peri hygiene with Lt UE. Pt able to reach Lt ankle and Rt LE lower calf area with bathing. pt required (A) with washing feet. Where Assessed - Lower Body  Bathing: Other (comment) (sit<>stand 3N1, peri hygiene performed standing) Upper Body Dressing: Performed;Minimal assistance Upper Body Dressing Details (indicate cue type and reason): pt using Lt Ue to lift Rt UE at wrist to assist with dressing Where Assessed - Upper Body Dressing: Other (comment) (sitting on 3N1) Lower Body Dressing: Performed;Maximal assistance Where Assessed - Lower Body Dressing: Other (comment);Supported (sitting 3N1) Toilet Transfer: Teacher, early years/pre Details (indicate cue type and reason): pt 80% of transfer. Pt with LT UE hand held support. Pt requires assistance shifting weight but is advancing Rt LE with decrease coordination  Toilet Transfer Method: Ambulating Toilet Transfer Equipment: Raised toilet seat with arms (or 3-in-1 over toilet) Toileting - Clothing Manipulation: Performed;Maximal assistance Where Assessed - Toileting Clothing Manipulation: Sit to stand from 3-in-1 or toilet Toileting - Hygiene: Performed;Moderate assistance Where Assessed - Toileting Hygiene: Sit to stand from 3-in-1 or toilet Equipment Used: Other (comment) (hand held (A)) Ambulation Related to ADLs: Pt sit<>stand from chair with mod v/c for hand placement. Pt sit<>stand with Mod (A). Pt weight bearing on Rt LE. Pt static standing with posterior lean and with cueing self correcting to min A level static standing. Pt provided bedside tray with tooth brush and toothpaste applied . Pt weight bearing through Rt UE ADL Comments: Pt taking extra time and attending to details. pt with wet pants on arrival and verbalizes to therapist. Pt required Mod (A) to doff pants. Pt voiding bowel and bladder in 3N1. Pt demontrates Brunstrom I (flaccid) Rt UE during session.  Additional Prior Functional Levels:  Bed Mobility: I Transfers: I Mobility - Walk/Wheelchair: I Upper Body Dressing: I Lower Body Dressing: I Grooming: I Eating/Drinking: I Toilet Transfer: I Bladder  Continence: WNL Bowel Management: WNL Stair Climbing: I Communication: WNL Memory: WNL Cooking/Meal Prep: A Housework: A Money Management: A Driving: No  Prior Activity Level: Limited Community (1-2x/wk): Went out 1-2 x per week  ADLs/Mobility: ADL Eating/Feeding: Performed;Minimal assistance Where Assessed - Eating/Feeding: Chair (supported) Grooming: Performed;Teeth care;Moderate assistance  Where Assessed - Grooming: Supported (Standing with bedside tray in front of pt) Upper Body Bathing: Performed;Chest;Left arm;Abdomen;Moderate assistance Upper Body Bathing Details (indicate cue type and reason): Pt provided bath sitting on 3N1 pt reports not having a bath since arrival. Pt required assistance bathing Lt underarm and Lt arm Where Assessed - Upper Body Bathing: Other (comment) (3N1 sitting) Lower Body Bathing: Performed;Moderate assistance Lower Body Bathing Details (indicate cue type and reason): Pt required (A) for posterior peri hygiene and support to perform front peri hygiene with Lt UE. Pt able to reach Lt ankle and Rt LE lower calf area with bathing. pt required (A) with washing feet. Where Assessed - Lower Body Bathing: Other (comment) (sit<>stand 3N1, peri hygiene performed standing) Upper Body Dressing: Performed;Minimal assistance Upper Body Dressing Details (indicate cue type and reason): pt using Lt Ue to lift Rt UE at wrist to assist with dressing Where Assessed - Upper Body Dressing: Other (comment) (sitting on 3N1) Lower Body Dressing: Performed;Maximal assistance Where Assessed - Lower Body Dressing: Other (comment);Supported (sitting 3N1) Toilet Transfer: Teacher, early years/pre Details (indicate cue type and reason): pt 80% of transfer. Pt with LT UE hand held support. Pt requires assistance shifting weight but is advancing Rt LE with decrease coordination  Toilet Transfer Method: Ambulating Toilet Transfer Equipment: Raised toilet seat  with arms (or 3-in-1 over toilet) Toileting - Clothing Manipulation: Performed;Maximal assistance Where Assessed - Toileting Clothing Manipulation: Sit to stand from 3-in-1 or toilet Toileting - Hygiene: Performed;Moderate assistance Where Assessed - Toileting Hygiene: Sit to stand from 3-in-1 or toilet Equipment Used: Other (comment) (hand held (A)) Ambulation Related to ADLs: Pt sit<>stand from chair with mod v/c for hand placement. Pt sit<>stand with Mod (A). Pt weight bearing on Rt LE. Pt static standing with posterior lean and with cueing self correcting to min A level static standing. Pt provided bedside tray with tooth brush and toothpaste applied . Pt weight bearing through Rt UE ADL Comments: Pt taking extra time and attending to details. pt with wet pants on arrival and verbalizes to therapist. Pt required Mod (A) to doff pants. Pt voiding bowel and bladder in 3N1. Pt demontrates Brunstrom I (flaccid) Rt UE during session.  Bed Mobility Bed Mobility: No (upright in chair on arrival) Rolling Right: 4: Min assist;With rail Rolling Right Details (indicate cue type and reason): decreased active trunkrotation noted Right Sidelying to Sit: 3: Mod assist;HOB flat;With rails Right Sidelying to Sit Details (indicate cue type and reason): decreased active trunk initiation and weight shift noted Supine to Sit: 4: Min assist;With rails;HOB flat Sitting - Scoot to Edge of Bed: 3: Mod assist Sitting - Scoot to Edge of Bed Details (indicate cue type and reason): decreased sequencing of functional task, likely due to mild motor apraxia, decreased dissociation and initiation at pelvis for weight shifts Transfers Transfers: Yes Sit to Stand: 3: Mod assist;With upper extremity assist;From bed Sit to Stand Details (indicate cue type and reason): pt requires facilitation at hips for lateral weight shift to assist with advancing Rt LE with gait sequence and for posterior lean. Stand to Sit: 4: Min  assist;With upper extremity assist;To chair/3-in-1 Stand to Sit Details: manual facilitation at right LE and trunk to increase initiation of anterior weight shift at pelvis and maintain midline weight bearing for forced use of right LE  Stand Pivot Transfers: 3: Mod assist Stand Pivot Transfer Details (indicate cue type and reason): improved control of lowering to sit this afternoon Ambulation/Gait Ambulation/Gait: No  Ambulation/Gait Assistance: 1: +1 Total assist Ambulation/Gait Assistance Details (indicate cue type and reason): manual facilitation for right stance components and anterior laterl weight shift to advance left LE Ambulation Distance (Feet): 3 Feet Assistive device:  (right hand held assist) Gait Pattern: Decreased stance time - right;Decreased step length - left (collapses onto right side, decreased sustained right stance) Gait velocity: significantly decreased Stairs: No Wheelchair Mobility Wheelchair Mobility: No Posture/Postural Control Posture/Postural Control: Postural limitations Postural Limitations: decreased sustained right trunk elongation, collapsing into right flexion. Postural muscles fatigue quickly in unsupported sitting. Initiates movement and balance with upper trunk. Balance and righting reactions delayed. Balance Balance Assessed: Yes Static Sitting Balance Static Sitting - Balance Support: No upper extremity supported;Feet supported Static Sitting - Level of Assistance: 4: Min assist Dynamic Sitting Balance Dynamic Sitting - Balance Support: No upper extremity supported;Feet supported;During functional activity Dynamic Sitting - Level of Assistance: 3: Mod assist Static Standing Balance Static Standing - Balance Support: Right upper extremity supported Static Standing - Level of Assistance: 3: Mod assist Dynamic Standing Balance Dynamic Standing - Balance Support: Right upper extremity supported (left heel lifts and pre-gait activity) Dynamic Standing  - Level of Assistance: 2: Max assist  Home Assistive Devices/Equipment:  Home Assistive Devices/Equipment Home Assistive Devices/Equipment: None  Discharge Planning:  Living Arrangements: Spouse/significant other Support Systems: Spouse/significant other Do you have any problems obtaining your medications?: No Type of Residence: Private residence Home Care Services: No Case Management Consult Needed: No  Previous Home Environment:  Living Arrangements: Spouse/significant other Support Systems: Spouse/significant other Do you have any problems obtaining your medications?: No Type of Residence: Private residence Home Care Services: No  Discharge Living Setting:  Plans for Discharge Living Setting: Patient's home;House;Lives with (comment) (Lives with wife, mother-inlaw, children 15 and 76 yo) Discharge Living Setting Number of Levels: 1 (Has a basement for storage) Discharge Living Setting Number of Steps: 2 Discharge Living Setting is Bedroom on Main Floor?: Yes Discharge Living Setting is Bathroom on Main Floor?: Yes  Social/Family/Support Systems:  Patient Roles: Spouse Contact Information: Edward Nestor (h) [phone] (c) [phone] Anticipated Caregiver: wife, wife's mom, son and daughter Anticipated Caregiver's Contact Information: wife - [phone] Ability/Limitations of Caregiver: wife works 5 am to 3 pm, children work 9a-5p, mother in Social worker can provide S and some assist Caregiver Availability: 24/7 Discharge Plan Discussed with Primary Caregiver: Yes Is Caregiver In Agreement with Plan?: Yes Does Caregiver/Family have Issues with Lodging/Transportation while Pt is in Rehab?: No  Goals/Additional Needs:  Patient/Family Goal for Rehab: PT min A, OT min/mod A, ST Mod I goals (ELOS = 2-3 weeks) Cultural Considerations: From [place], speaks some [place] and [place] Dietary Needs: Dysphagia 3, thin liquids Equipment Needs: TBD Pt/Family Agrees to Admission and willing to participate:  Yes Program Orientation Provided & Reviewed with Pt/Caregiver Including Roles  & Responsibilities: Yes  Preadmission Screen Completed By:  Trish Mage, 04/06/2011 9:23 AM  Patient's condition:  This patient's condition remains as documented in the Consult dated 04/04/11, in which the Rehabilitation Physician determined and documented that the patient's condition is appropriate for intensive rehabilitative care in an inpatient rehabilitation facility.  Preadmission Screen Competed by: Roderic Palau, RN, Time/Date,915/04/06/13.  Discussed status with Dr. Wynn Banker on 04/06/11 at 1030 (time/date) and received telephone approval for admission today.  Admission Coordinator:  Trish Mage, time1030/Date01/24/13

## 2011-04-06 NOTE — Progress Notes (Signed)
Physical Therapy Treatment Patient Details Name: Edward Choi MRN: [id number] DOB: 05/17/1950 Today's Date: 04/06/2011  PT Assessment/Plan  PT - Assessment/Plan Comments on Treatment Session: Pt performed coordination activities with RLE, lateral weight shifting, standing activities with focus of even distribution of weight through bil LE's & weight bearing position RUE, forward<>backward advancement of LE's.   PT Plan: Discharge plan remains appropriate PT Frequency: Min 4X/week Follow Up Recommendations: Inpatient Rehab Equipment Recommended: Defer to next venue PT Goals  Acute Rehab PT Goals PT Goal: Sit to Stand - Progress: Progressing toward goal PT Goal: Stand to Sit - Progress: Progressing toward goal PT Goal: Stand - Progress: Progressing toward goal PT Goal: Ambulate - Progress: Progressing toward goal  PT Treatment Precautions/Restrictions  Precautions Precautions: Fall Precaution Comments: Blurry vision, right hemiplegia Required Braces or Orthoses: No Restrictions Weight Bearing Restrictions: No Mobility (including Balance) Bed Mobility Bed Mobility: No Transfers Sit to Stand: 3: Mod assist;From chair/3-in-1;With armrests;With upper extremity assist Sit to Stand Details (indicate cue type and reason): (A) to achieve standing, anterior weight-shifting of trunk over BOS, lateral weight-shifting to evenly distribute weight through Bil LE's.   Stand to Sit: 4: Min assist;To chair/3-in-1;With upper extremity assist;With armrests Stand to Sit Details: (A) to control descent.   Ambulation/Gait Ambulation/Gait Assistance: 1: +2 Total assist;Other (comment) (pt=60% ) Ambulation/Gait Assistance Details (indicate cue type and reason): (A) for balance, lateral weight shifting in order to advance R<>L.   Ambulation Distance (Feet): 10 Feet (2x's. ) Assistive device: 2 person hand held assist Gait Pattern: Decreased step length - right;Decreased step length -  left;Ataxic Stairs: No Wheelchair Mobility Wheelchair Mobility: No  Cytogeneticist Standing - Balance Support: Bilateral upper extremity supported;Right upper extremity supported;During functional activity Static Standing - Level of Assistance: 3: Mod assist Static Standing - Comment/# of Minutes: During functional activity with OT.  (A) provided on Rt. side.  pt with lateral trunk lean to Rt.   Exercise    End of Session PT - End of Session Equipment Utilized During Treatment: Gait belt Activity Tolerance: Patient tolerated treatment well Patient left: in chair;with call bell in reach General Behavior During Session: [facility] for tasks performed Cognition: [facility] for tasks performed  Lara Mulch 04/06/2011, 1:07 PM [phone]

## 2011-04-06 NOTE — Plan of Care (Addendum)
Overall Plan of Care [facility]) Patient Details Name: Edward Choi MRN: [id number] DOB: 1951-02-01  Diagnosis:  Rehabilitation for CVA  Primary Diagnosis:    Left paramedian pontine infarct Co-morbidities: Dysarthria, right hemiparesis severe,HTN  Functional Problem List  Patient demonstrates impairments in the following areas: Balance, Bladder, Bowel, Edema, Endurance, Medication Management, Motor, Pain, Safety and Skin Integrity  Basic ADL's: eating, grooming, bathing, dressing and toileting Advanced ADL's: simple meal preparation  Transfers:  bed mobility, bed to chair, toilet, tub/shower, car and floor Locomotion:  ambulation, wheelchair mobility and stairs  Additional Impairments:  Functional use of upper extremity  Anticipated Outcomes Item Anticipated Outcome  Eating/Swallowing  Mod I  Basic self-care  Mod I  Tolieting  Mod I  Bowel/Bladder  Continent bowel and bladder, modified independence  Transfers  Mod I  Locomotion  Mod I  Communication  Mod I  Cognition    Pain  3 or less  Safety/Judgment  supervision  Other     Therapy Plan: OT 1-2x a day, 60- 90 minutes, 5 out of 7 days PT Frequency: 1-2 X/day, 60-90 minutes;5 out of 7 days OT Frequency: 1-2 X/day, 60-90 minutes SLP Frequency: 1-2 X/day, 30-60 minutes;3 out of 7 days   Team Interventions: Item RN PT OT SLP SW TR Other  Self Care/Advanced ADL Retraining   x      Neuromuscular Re-Education  x x      Therapeutic Activities  x x x     UE/LE Strength Training/ROM  x x      UE/LE Coordination Activities  x x      Visual/Perceptual Remediation/Compensation         DME/Adaptive Equipment Instruction  x x      Therapeutic Exercise  x x x     Balance/Vestibular Training  x x      Patient/Family Education x x x x     Cognitive Remediation/Compensation         Functional Mobility Training  x x      Ambulation/Gait Training  x       Museum/gallery curator  x       Wheelchair Propulsion/Positioning  x        Librarian, academic x   x     Speech/Language Facilitation    x     Bladder Management x        Bowel Management x        Disease Management/Prevention x        Pain Management x        Medication Management x        Skin Care/Wound Management         Splinting/Orthotics  x       Discharge Planning x x   x    Psychosocial Support     x                       Team Discharge Planning: Destination:  Home Projected Follow-up:  PT, OT, Home Health vs. Outpatient, SLP none Projected Equipment Needs:  Cane and Tub Bench Patient/family involved in discharge planning:  No family available  MD ELOS: 3 wks Medical Rehab Prognosis:  Good Assessment: 61 yo Asian male with limited English sustained L pontine infarct now requiring CIR level PT,OT,SLP, 24/7 Rehab RN and daily PMR MD visits

## 2011-04-07 DIAGNOSIS — I6330 Cerebral infarction due to thrombosis of unspecified cerebral artery: Secondary | ICD-10-CM

## 2011-04-07 DIAGNOSIS — I639 Cerebral infarction, unspecified: Secondary | ICD-10-CM

## 2011-04-07 DIAGNOSIS — Z5189 Encounter for other specified aftercare: Secondary | ICD-10-CM

## 2011-04-07 DIAGNOSIS — G8100 Flaccid hemiplegia affecting unspecified side: Secondary | ICD-10-CM

## 2011-04-07 LAB — COMPREHENSIVE METABOLIC PANEL
AST: 33 U/L (ref 0–37)
Albumin: 3.3 g/dL — ABNORMAL LOW (ref 3.5–5.2)
Alkaline Phosphatase: 81 U/L (ref 39–117)
CO2: 23 mEq/L (ref 19–32)
Chloride: 105 mEq/L (ref 96–112)
Creatinine, Ser: 0.93 mg/dL (ref 0.50–1.35)
GFR calc non Af Amer: 89 mL/min — ABNORMAL LOW (ref >90)
Potassium: 4.2 mEq/L (ref 3.5–5.1)
Total Bilirubin: 0.5 mg/dL (ref 0.3–1.2)

## 2011-04-07 LAB — DIFFERENTIAL
Basophils Absolute: 0.1 10*3/uL (ref 0.0–0.1)
Basophils Relative: 1 % (ref 0–1)
Eosinophils Absolute: 0.2 10*3/uL (ref 0.0–0.7)
Lymphocytes Relative: 29 % (ref 12–46)
Lymphs Abs: 1.9 10*3/uL (ref 0.7–4.0)
Monocytes Absolute: 0.6 10*3/uL (ref 0.1–1.0)
Neutro Abs: 3.9 10*3/uL (ref 1.7–7.7)

## 2011-04-07 LAB — CBC
HCT: 39.8 % (ref 39.0–52.0)
MCV: 58.8 fL — ABNORMAL LOW (ref 78.0–100.0)
Platelets: 232 10*3/uL (ref 150–400)
RBC: 6.77 MIL/uL — ABNORMAL HIGH (ref 4.22–5.81)
RDW: 18.6 % — ABNORMAL HIGH (ref 11.5–15.5)
WBC: 6.7 10*3/uL (ref 4.0–10.5)

## 2011-04-07 NOTE — Progress Notes (Signed)
Physical Therapy Session Note  Patient Details  Name: Edward Choi MRN: [id number] Date of Birth: 04-27-1950  Today's Date: 04/07/2011 Time: 1300-1330 Time Calculation (min): 30 min  Precautions: Precautions Precautions: Fall Precaution Comments: Dys 3 diet with thin liquids Required Braces or Orthoses: No Restrictions Weight Bearing Restrictions: No  Short Term Goals: PT Short Term Goal 1: Patient will perform bed mobility flat bed to L and R with consistent min A with minimal verbal and tactile cues. PT Short Term Goal 2: Patient will perform bed  <> chair transfers to L and R with consistent min A with intermittent verbal and tactile cues. PT Short Term Goal 3: Patient will perform w/c mobility on unit with L hemi technique with min A PT Short Term Goal 4: Patient will perform gait training x 100' with LRAD and mod A. PT Short Term Goal 5: Patient will perform up and down 3 stairs with one rail and mod A for home entry/exit.  Pain Pain Assessment Pain Assessment: No/denies pain Pain Score: 0-No pain  Other Treatments Treatments Therapeutic Activity: Bed mobility sequence training in supine with focus on RLE re-education for hip and knee flexion <> extension in supine and sidelying with gravity minimized with AAROM, hip ER and IR for rolling, bilat LE bridges for scooting and bilat UE reaching across midline with chin tuck, neck flexion for core activation for trunk flexion during rolling and side to sit beginning with mod-max A for sequence and progressing to min A rolling to L and R but still with max to total verbal and tactile cues for sequence; side to sit with facilitation at upper trunk for forward rotation and at pelvis for depression to sit upright.  Therapy/Group: Individual Therapy  [name] [facility] 04/07/2011, 4:09 PM

## 2011-04-07 NOTE — Progress Notes (Signed)
Social Work Assessment and Plan Assessment and Plan  Patient Name: Edward Choi  [id number] Date: 04/07/2011  Problem List:  Patient Active Problem List  Diagnoses  . CAD (coronary artery disease)  . CVA (cerebrovascular accident)  . Hypertension  . Depression  . Hx of CABG  . Hyperlipidemia  . Stroke    Past Medical History:  Past Medical History  Diagnosis Date  . Hypertension   . Acute MI   . Coronary artery disease   . Depression   . Back pain     Past Surgical History:  Past Surgical History  Procedure Date  . Coronary artery bypass graft 2006    Discharge Planning  Discharge Planning Living Arrangements: Spouse/significant other;Children;Family members Support Systems: Spouse/significant other;Parent;Children;Church/faith community Assistance Needed: May require assistance at discharge-mother in-law with needs to be supervision level Patient expects to be discharged to:: Home Case Management Consult Needed: Yes (Comment) (RNCM-following)  Social/Family/Support Systems Social/Family/Support Systems Patient Roles: Spouse;Parent Anticipated Industrial/product designer Information: [phone]  [phone]  Employment Status Employment Status Employment Status: Disabled Fish farm manager Issues: No issues Guardian/Conservator: None  Abuse/Neglect Abuse/Neglect Assessment (Assessment to be complete while patient is alone) Physical Abuse: Denies Verbal Abuse: Denies Sexual Abuse: Denies Exploitation of patient/patient's resources: Denies Self-Neglect: Denies  Emotional Status Emotional Status Pt's affect, behavior adn adjustment status: Pt smiling and answers simple questions.  He wants to get his arm and leg to move when he wants it too. That is his goal. Recent Psychosocial Issues: Other medical issues questioned non-compliant with meds will discuss with wife. Pyschiatric History: Per chart history depression not on meds now. Pt reports  feel ok do ok. Will await wife or get interpreter to discuss this further.  Patient/Family Perceptions, Expectations & Goals Pt/Family Perceptions, Expectations and Goals Pt/Family understanding of illness & functional limitations: Pt points to his arm and leg and says does not work but will be ok.  He is positive and motivated to improve.  Appears to have a limited understanding. Premorbid pt/family roles/activities: Husband, Father, Retiree, Homeowner, Church Member,etc Anticipated changes in roles/activities/participation: Plans to resume these roles. Pt/family expectations/goals: Pt states: " I want to do for self."  Pt does not want Mother in-law to have to help him.  Pt always has been independent and wants to remain so.  Community Resources [employer] Agencies: None Premorbid Home Care/DME Agencies: None Transportation available at discharge: Family Resource referrals recommended: Support group (specify) (CVA Support Group)  Discharge Visual merchandiser Resources: Media planner (specify) ([employer]-BCBS) Financial Resources: Family Support;Social Theatre manager Screen Referred: No Living Expenses: Lives with family Money Management: Spouse Home Management: Family members Patient/Family Preliminary Plans: Retrun home with wife and family to assist.  Mother in-law there while family members work-overlap three hours.  Pt wants to be able to do for himself.  Clinical Impression:  Pleasant smiling gentleman who reports wants to do for himself again.  Supportive family who can provide 24 hour supervision level.  Question non-compliance with meds will discuss with wife in more detail.  Left message for wife, pt reports working.        Lucy Chris 04/07/2011

## 2011-04-07 NOTE — Evaluation (Signed)
Speech Language Pathology Assessment and Plan  Patient Details  Name: Edward Choi MRN: [id number] Date of Birth: 1950/12/19  SLP Diagnosis: dysarthria Rehab Potential: Good ELOS: 1 week for SLP   Time: 1420-1445 Time Calculation (min): 25 min  Assessment & Plan Clinical Impression: Patient is a 61 year old right-handed [place] male with history of coronary artery disease with bypass grafting in 2006. Patient on no medications prior to hospital admission with questionable medical compliance. Patient independent prior to admission. Admitted January 21 with right-sided weakness. 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 MRA of the head with mild intracranial atherosclerotic type changes.  Patient transferred to CIR on 04/06/2011 and upon evaluation presents with right upper motor neuron dysarthria characterized by "bulbar" speech with reduced intelligibility at the sentence level of verbal expression.  As a result patient would benefit from skilled SLP services to address deficits.  SLP - End of Session Patient left: in chair;with call bell in reach Nurse Communication: Other (comment) Assessment Rehab Potential: Good Barriers to Discharge: None Therapy Diagnosis: Dysarthria Type of Dysarthria: Other (comment) (right upper motor neuron dysarthria) SLP Plan SLP Frequency: 1-2 X/day, 30-60 minutes;3 out of 7 days Estimated Length of Stay: 1 week for SLP SLP Treatment/Interventions: Patient/family education;Therapeutic Activities;Therapeutic Exercise Recommendation Follow up Recommendations: None Equipment Recommended: None recommended by SLP Pain Pain Assessment Pain Assessment: No/denies pain Pain Score: 0-No pain Prior Functioning Independent  Cognition Overall Cognitive Status: Appears within functional limits for tasks assessed Comprehension Auditory Comprehension Overall Auditory Comprehension: Appears within functional limits for  tasks assessed Expression Expression Primary Mode of Expression: Verbal Verbal Expression Overall Verbal Expression: Impaired Initiation: No impairment Automatic Speech: Counting Level of Generative/Spontaneous Verbalization: Sentence Naming: No impairment Pragmatics: No impairment Non-Verbal Means of Communication: Not applicable Written Expression Written Expression: Unable to assess (comment) (weak right upper extremity) Oral/Motor Oral Motor/Sensory Function Overall Oral Motor/Sensory Function: Impaired Labial ROM: Reduced left Labial Symmetry: Abnormal symmetry right Labial Strength: Reduced Lingual ROM: Other (Comment) (reduced) Lingual Symmetry: Abnormal symmetry right (slight deviation) Lingual Strength: Reduced Lingual Sensation: Within Functional Limits Facial ROM: Within Functional Limits Facial Symmetry: Within Functional Limits Facial Strength: Within Functional Limits Facial Sensation: Within Functional Limits Velum: Within Functional Limits Mandible: Within Functional Limits Motor Speech Overall Motor Speech: Impaired Articulation: Impaired Level of Impairment: Sentence Intelligibility: Intelligibility reduced Sentence: 75-100% accurate Motor Planning: Impaired (some oral apraxia observed with oral motor exercises)   Recommendations for other services: None  Discharge Criteria: Patient will be discharged from SLP if patient refuses treatment 3 consecutive times without medical reason, if treatment goals not met, if there is a change in medical status, if patient makes no progress towards goals or if patient is discharged from hospital.  The above assessment, treatment plan, treatment alternatives and goals were discussed and mutually agreed upon: by patient   Edward Choi., CCC-SLP [phone]  Edward Choi 04/07/2011 3:28 PM

## 2011-04-07 NOTE — Progress Notes (Signed)
Patient ID: Edward Choi, male   DOB: 02-26-1951, 61 y.o.   MRN: [id number]  Subjective/Complaints: Ok today Review of Systems  Respiratory: Negative.   Cardiovascular: Negative.   Gastrointestinal: Positive for constipation.  Neurological: Positive for focal weakness.   Objective: Vital Signs: Blood pressure 146/89, pulse 82, temperature 98.3 F (36.8 C), temperature source Oral, resp. rate 16, SpO2 96.00%. No results found. Results for orders placed during the hospital encounter of 04/06/11 (from the past 72 hour(s))  MRSA PCR SCREENING     Status: Normal   Collection Time   04/06/11  5:11 PM      Component Value Range Comment   MRSA by PCR NEGATIVE  NEGATIVE    CBC     Status: Abnormal   Collection Time   04/07/11  5:45 AM      Component Value Range Comment   WBC 6.7  4.0 - 10.5 (K/uL)    RBC 6.77 (*) 4.22 - 5.81 (MIL/uL)    Hemoglobin 13.4  13.0 - 17.0 (g/dL)    HCT 40.9  81.1 - 91.4 (%)    MCV 58.8 (*) 78.0 - 100.0 (fL)    MCH 19.8 (*) 26.0 - 34.0 (pg)    MCHC 33.7  30.0 - 36.0 (g/dL)    RDW 78.2 (*) 95.6 - 15.5 (%)    Platelets 232  150 - 400 (K/uL)   COMPREHENSIVE METABOLIC PANEL     Status: Abnormal   Collection Time   04/07/11  5:45 AM      Component Value Range Comment   Sodium 139  135 - 145 (mEq/L)    Potassium 4.2  3.5 - 5.1 (mEq/L)    Chloride 105  96 - 112 (mEq/L)    CO2 23  19 - 32 (mEq/L)    Glucose, Bld 108 (*) 70 - 99 (mg/dL)    BUN 12  6 - 23 (mg/dL)    Creatinine, Ser 2.13  0.50 - 1.35 (mg/dL)    Calcium 8.8  8.4 - 10.5 (mg/dL)    Total Protein 7.4  6.0 - 8.3 (g/dL)    Albumin 3.3 (*) 3.5 - 5.2 (g/dL)    AST 33  0 - 37 (U/L)    ALT 30  0 - 53 (U/L)    Alkaline Phosphatase 81  39 - 117 (U/L)    Total Bilirubin 0.5  0.3 - 1.2 (mg/dL)    GFR calc non Af Amer 89 (*) >90 (mL/min)    GFR calc Af Amer >90  >90 (mL/min)   DIFFERENTIAL     Status: Normal   Collection Time   04/07/11  5:45 AM      Component Value Range Comment   Neutrophils  Relative 58  43 - 77 (%)    Lymphocytes Relative 29  12 - 46 (%)    Monocytes Relative 9  3 - 12 (%)    Eosinophils Relative 3  0 - 5 (%)    Basophils Relative 1  0 - 1 (%)    Neutro Abs 3.9  1.7 - 7.7 (K/uL)    Lymphs Abs 1.9  0.7 - 4.0 (K/uL)    Monocytes Absolute 0.6  0.1 - 1.0 (K/uL)    Eosinophils Absolute 0.2  0.0 - 0.7 (K/uL)    Basophils Absolute 0.1  0.0 - 0.1 (K/uL)    RBC Morphology TARGET CELLS      HENT:  Head: Normocephalic.  Neck: Normal range of motion. No thyromegaly present.  Cardiovascular: Normal  rate and regular rhythm.  Pulmonary/Chest: Breath sounds normal. He has no wheezes.  Abdominal: He exhibits no distension. There is no tenderness.  Musculoskeletal: He exhibits no edema.  Neurological: He is alert.  Follows basic two-step motor commands. Answers simple questions. There is a language barrier but patient can communicate his needs. Patient with 1/5 strength intermittently throughout the right upper extremity. He is perhaps 1-1+ out of 5 right lower extremity with some motor planning issues. Is a right central 7 and tongue deviation. Speech is slightly dysarthric. Sensation is right arm and leg Mildly diminished. Reflexes are 1+ throughout.  Skin: Skin is warm and dry.  Psychiatric: He has a normal mood and affect  CN central 7 on    Assessment/Plan: 1. Functional deficits secondary to L pontine infarct which require 3+ hours per day of interdisciplinary therapy in a comprehensive inpatient rehab setting. Physiatrist is providing close team supervision and 24 hour management of active medical problems listed below. Physiatrist and rehab team continue to assess barriers to discharge/monitor patient progress toward functional and medical goals. FIM: FIM - Bathing Bathing: 0: Activity did not occur  FIM - Upper Body Dressing/Undressing Upper body dressing/undressing: 0: Wears gown/pajamas-no public clothing FIM - Lower Body Dressing/Undressing Lower body  dressing/undressing: 0: Wears gown/pajamas-no public clothing  FIM - Toileting Toileting steps completed by patient: Adjust clothing prior to toileting Toileting Assistive Devices: Grab bar or rail for support Toileting: 0: Activity did not occur     FIM - Games developer Transfer: 1: Two helpers  FIM - Locomotion: Wheelchair Locomotion: Wheelchair: 0: Activity did not occur FIM - Locomotion: Ambulation Locomotion: Ambulation: 0: Activity did not occur  Comprehension Comprehension Mode: Auditory Comprehension: 5-Follows basic conversation/direction: With no assist  Expression Expression Mode: Verbal Expression: 5-Expresses complex 90% of the time/cues < 10% of the time  Social Interaction Social Interaction: 5-Interacts appropriately 90% of the time - Needs monitoring or encouragement for participation or interaction.  Problem Solving Problem Solving Mode: Not assessed  Memory Memory: 5-Recognizes or recalls 90% of the time/requires cueing < 10% of the time  2. Anticoagulation/DVT prophylaxis with Pharmaceutical: Lovenox 3. Pain Management: Tylenol  Medical Problem List and Plan:  1. left paracentral pontine infarction. Continue aspirin therapy  2. DVT Prophylaxis/Anticoagulation: Subcutaneous Lovenox. Monitor platelet count and any bleeding episodes  3. Dysphagia. Mechanical soft diet thin liquids. Monitor for any signs of aspiration. Speech therapy to follow up  4. Hyperlipidemia. Crestor  5. Hypertension. Currently on no antihypertensive medications. Will monitor the increased activity  6. Coronary artery disease. CABG 06. Continue aspirin  7. Medical noncompliance. Provide counseling   LOS (Days) 1 A FACE TO FACE EVALUATION WAS PERFORMED  KIRSTEINS,ANDREW E 04/07/2011, 7:16 AM

## 2011-04-07 NOTE — Progress Notes (Signed)
[facility] Individual Statement of Services  Patient Name:  Edward Choi  Date:  04/07/2011  Welcome to the [facility]patient [facility].  Our goal is to provide you with an individualized program based on your diagnosis and situation, designed to meet your specific needs.  With this comprehensive rehabilitation program, you will be expected to participate in at least 3 hours of rehabilitation therapies Monday-Friday, with modified therapy programming on the weekends.  Your rehabilitation program will include the following services:  Physical Therapy (PT), Occupational Therapy (OT), Speech Therapy (ST), 24 hour per day rehabilitation nursing, Therapeutic Recreaction (TR), Case Management (RN and Child psychotherapist), Rehabilitation Medicine, Nutrition Services and Pharmacy Services  Weekly team conferences will be held on  Wednesday  to discuss your progress.  Your RN Case Designer, television/film set will talk with you frequently to get your input and to update you on team discussions.  Team conferences with you and your family in attendance may also be held.  Estimated Length of Stay: 3 weeks                          Goals: Modified Independent-Supervision  Depending on your progress and recovery, your program may change.  Your RN Case Estate agent will coordinate services and will keep you informed of any changes.  Your RN Sports coach and SW names and contact numbers are listed  below.  The following services may also be recommended but are not provided by the Inpatient Rehabilitation [facility]:   Driving Evaluations  Home Health Rehabiltiation Services  Outpatient Rehabilitatation [facility]  Vocational Rehabilitation   Arrangements will be made to provide these services after discharge if needed.  Arrangements include referral to agencies that provide these services.  Your insurance has been verified to be:  [place] Your primary doctor is:  Dr. Cheri Rous  Pertinent information will be shared with your doctor and your insurance company.  Case Manager: Lutricia Horsfall, [facility] [phone]  Social Worker:  Dossie Der, [place] [phone]  Information discussed with and copy given to patient by: Brock Ra, 04/07/2011, 12:31 PM

## 2011-04-07 NOTE — Progress Notes (Signed)
Clinical/Bedside Swallow Evaluation Patient Details  Name: Edward Choi MRN: [id number] DOB: [birth year] Today's Date: 04/07/2011 Individual Session  Time 1400-1420  Past Medical History:  Past Medical History  Diagnosis Date  . Hypertension   . Acute MI   . Coronary artery disease   . Depression   . Back pain    Past Surgical History:  Past Surgical History  Procedure Date  . Coronary artery bypass graft 20084   HPI:  61 year old right-handed [place] male with history of coronary artery disease with bypass grafting in 2006. Patient on no medications prior to hospital admission with questionable medical compliance. Patient independent prior to admission. Admitted January 21 with right-sided weakness. 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 MRA of the head with mild intracranial atherosclerotic type changes. Bedside swallow evaluation on acute completed   Assessment/Recommendations/Treatment Plan SLP Assessment Clinical Impression Statement: Patient presents with right sided oral weakness which results in slighty prolonged mastication and oral reside post swallow, which cleared with a liquid wash.  Of note, trials of thin via straw revealed delayed throat clear as a reuslt of suspected penetration.  Patient verbalized undestanding of small sips and removed straw from cup before finishing his thin liquids.  As a result it is recommended that patient consume regular textures and thin liquids following set up assist and with intermittent supervision.  Skilled SLP services warranted at this time fo diagnositc treatment of swallow.   Risk for Aspiration: Mild  Swallow Evaluation Recommendations Solid Consistency: Regular Liquid Consistency: Thin Liquid Administration via: No straw;Cup Medication Administration: Whole meds with liquid Supervision: Patient able to self feed;Intermittent supervision to cue for compensatory strategies Compensations:  Slow rate;Small sips/bites Postural Changes and/or Swallow Maneuvers: Seated upright 90 degrees Oral Care Recommendations: Oral care BID Follow up Recommendations: None  Treatment Plan Treatment Plan Recommendations: Therapy as outlined in treatment plan below Speech Therapy Frequency: min 5x/week Treatment Duration: 1 week Interventions: Aspiration precaution training;Oral motor exercises;Compensatory techniques;Patient/family education;Trials of upgraded texture/liquids;Diet toleration management by SLP;Group dysphagia treatment  Prognosis Prognosis for Safe Diet Advancement: Good  Individuals Consulted Consulted and Agree with Results and Recommendations: Patient  Swallowing Goals  SLP Swallowing Goals Patient will consume recommended diet without observed clinical signs of aspiration with: Modified independent assistance Patient will utilize recommended strategies during swallow to increase swallowing safety with: Modified independent assistance  Swallow Study Oral Motor/Sensory Function  Overall Oral Motor/Sensory Function: Impaired Labial ROM: Reduced left Labial Symmetry: Abnormal symmetry right Labial Strength: Reduced Lingual ROM: Other (Comment) (reduced) Lingual Symmetry: Abnormal symmetry right (slight deviation) Lingual Strength: Reduced Lingual Sensation: Within Functional Limits Facial ROM: Within Functional Limits Facial Symmetry: Within Functional Limits Facial Strength: Within Functional Limits Facial Sensation: Within Functional Limits Velum: Within Functional Limits Mandible: Within Functional Limits  Consistency Results  Ice Chips Ice chips: Not tested  Thin Liquid Presentation: Cup;Spoon;Self Fed Pharyngeal  Phase Impairments: Multiple swallows;Throat Clearing - Delayed (throat clear with straw sips)  Nectar Thick Liquid Nectar Thick Liquid: Not tested  Honey Thick Liquid Honey Thick Liquid: Not tested  Puree Puree: Within functional  limits  Solid Solid: Within functional limits (oral residue cleared with liquid wash)  Edward Choi, M.A., CCC-SLP [phone]  Edward Choi 04/07/2011,3:13 PM

## 2011-04-07 NOTE — Evaluation (Signed)
Physical Therapy Assessment and Plan  Patient Details  Name: Edward Choi MRN: [id number] Date of Birth: [birth year]  PT Diagnosis: Abnormality of gait, Hemiplegia dominant and Impaired sensation Rehab Potential: Good ELOS: 3 weeks   Today's Date: 04/07/2011 Time: 7829-5621 Time Calculation (min): 55 min  Assessment & Plan Clinical Impression: Patient is a 61 y.o. year oldThai male with history of coronary artery disease with bypass grafting in 2006. Patient on no medications prior to hospital admission with questionable medical compliance. Patient independent prior to admission. Admitted April 03, 2011 with right-sided weakness. 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 MRA of the head with mild intracranial atherosclerotic type changes. Patient did not receive TPA. Echocardiogram with ejection fraction of 60% without emboli.  Carotid Dopplers with no significant extracranial carotid artery stenosis. Neurology services consulted placed on aspirin therapy as well as subcutaneous Lovenox.  Patient transferred to CIR on 04/06/2011 .  Patient's past medical history is significant for:  Hypertension     Acute MI     Coronary artery disease     Depression     Back pain      Patient currently requires max to total assistance with mobility secondary to muscle weakness, impaired timing and sequencing, decreased coordination and decreased motor planning, impaired sensation and decreased standing balance, decreased postural control, hemiplegia and decreased balance strategies.  Prior to hospitalization, patient was independent with mobility and lived with Spouse in a House home.  Home access is 3Stairs to enter with rail on R side.  Patient will benefit from skilled PT intervention to maximize safe functional mobility, minimize fall risk and decrease caregiver burden for planned discharge home with intermittent assist.  Anticipate patient will benefit from  follow up HH vs. Outpatient at discharge.  PT - End of Session Activity Tolerance: Tolerates 30+ min activity without fatigue Endurance Deficit: No PT Assessment Rehab Potential: Good Barriers to Discharge: None PT Plan PT Frequency: 1-2 X/day, 60-90 minutes;5 out of 7 days Estimated Length of Stay: 3 weeks PT Treatment/Interventions: Ambulation/gait training;Balance/vestibular training;DME/adaptive equipment instruction;Functional mobility training;Neuromuscular re-education;Patient/family education;Splinting/orthotics;Stair training;Therapeutic Activities;Therapeutic Exercise;UE/LE Strength taining/ROM;UE/LE Coordination activities;Wheelchair propulsion/positioning PT Recommendation Follow Up Recommendations: HH vs. Outpatient PT Equipment Recommended: Quad cane  Precautions/Restrictions Precautions Precautions: Fall Precaution Comments: Dys 3 diet with thin liquids Required Braces or Orthoses: No Restrictions Weight Bearing Restrictions: No Vital Signs Therapy Vitals Pulse Rate: 94  BP: 131/89 mmHg Patient Position, if appropriate: Sitting Pain Pain Assessment Pain Assessment: No/denies pain Pain Score: 0-No pain Home Living/Prior Functioning Home Living Lives With: Spouse Receives Help From: Family Type of Home: House Home Layout: One level Home Access: Stairs to enter Entrance Stairs-Rails: Right Entrance Stairs-Number of Steps: 3 How Accessible: Accessible via walker Home Adaptive Equipment: Walker - rolling Prior Function Level of Independence: Independent with gait;Independent with transfers Able to Take Stairs?: Yes Cognition Overall Cognitive Status: Appears within functional limits for tasks assessed [facility] for basic ADL tasks) Arousal/Alertness: Awake/alert Orientation Level: Oriented X4 Sensation Sensation Light Touch: Impaired by gross assessment (impaired RLE) Coordination Gross Motor Movements are Fluid and Coordinated: No Motor  Motor Motor:  Hemiplegia;Motor impersistence Motor - Skilled Clinical Observations: R hemiplegia, able to activate LE extensors and flexors but unable to maintain in WB  Mobility Bed Mobility Bed Mobility: Yes Supine to Sit: 3: Mod assist;1: +1 Total assist Supine to Sit Details: Tactile cues for initiation;Tactile cues for sequencing;Verbal cues for sequencing Supine to Sit Details (indicate cue type and reason): On flat  mat, no rails patient performed sit to supine with mod A to bring RLE onto mat; supine to sit patient required total A; began bed mobility training with total verbal and tactile cues for LE flexion, trunk rotation for rolling and to maintain forward lean and rotation to bring upper trunk over BOS during side to sit from R side.  Patient attempting to use extension to perform side to sit.   Transfers Transfers: Yes Sit to Stand: 4: Min assist Sit to Stand Details: Manual facilitation for weight shifting;Manual facilitation for placement;Manual facilitation for weight bearing Stand to Sit: 4: Min assist Stand to Sit Details (indicate cue type and reason): Manual facilitation for weight shifting;Manual facilitation for weight bearing;Manual facilitation for placement Stand Pivot Transfers: 2: Max assist Stand Pivot Transfer Details: Tactile cues for initiation;Manual facilitation for weight shifting;Manual facilitation for weight bearing;Verbal cues for sequencing Stand Pivot Transfer Details (indicate cue type and reason): Stand pivot with L HHA and manual facilitation of RLE for placement and input for extension to asisst with sit to stand; verbal and tactile cues for sequence and to advance RLE and facilitation for RLE extension during stance for stabilization Locomotion  Ambulation Ambulation: Yes Ambulation/Gait Assistance: 1: +1 Total assist Ambulation Distance (Feet): 50 Feet (x 2) Assistive device: Large base quad cane;1 person hand held assist Ambulation/Gait Assistance Details:  Tactile cues for sequencing;Tactile cues for weight shifting;Tactile cues for posture;Visual cues for safe use of DME/AE;Visual cues/gestures for sequencing;Verbal cues for sequencing;Verbal cues for precautions/safety;Verbal cues for gait pattern;Verbal cues for safe use of DME/AE;Manual facilitation for weight shifting;Manual facilitation for placement;Manual facilitation for weight bearing Ambulation/Gait Assistance Details (indicate cue type and reason): First assessment of gait with L HHA x 50' on level surface; patient demonstrated lateral trunk lean and extension to advance RLE, RLE collapse during stance with decreased stance time and decreased LLE step length, R foot drop.  Gait training with quad cane in LUE and manual facilitation at trunk for elongation, RLE advancement and ER and facilitation at R pelvis for anterior rotation and translation and for stabilization at knee to prevent collapsing and genu recurvatum Stairs / Additional Locomotion Stairs: Yes Stairs Assistance: 1: +1 Total assist Stairs Assistance Details: Tactile cues for initiation;Tactile cues for sequencing;Tactile cues for weight shifting;Tactile cues for placement;Tactile cues for weight beaing;Visual cues/gestures for sequencing;Verbal cues for sequencing;Verbal cues for technique;Verbal cues for gait pattern Stairs Assistance Details (indicate cue type and reason): Visual demonstration of safe stair sequence; required assistance for RLE advancement to next step and manual facilitation for anterior translation of COG over stance LE and stabilization at R hip and knee during stance, trace activation of RLE extensors noted but unable to maintain. Stair Management Technique: One rail Left;Forwards;Step to pattern Number of Stairs: 3  Height of Stairs: 4  Wheelchair Mobility Wheelchair Mobility: Yes Wheelchair Assistance: 2: Max Chiropodist Details: Tactile cues for initiation;Tactile cues for  sequencing;Visual cues for safe use of DME/AE;Visual cues/gestures for sequencing;Verbal cues for sequencing;Verbal cues for technique Wheelchair Propulsion: Left lower extremity Wheelchair Parts Management: Needs assistance Distance: 150 feet; educated on use of LUE for w/c parts management.  Performed w/c mobility on level surface with use of LLE for propulsion with manual facilitation for initiation and sequence.  Trunk/Postural Assessment  Postural Control Postural Control: Deficits on evaluation Postural Limitations: decreased sustained right trunk elongation, collapsing into right flexion. Initiates movement and balance with upper trunk. Balance and righting reactions delayed.  Balance Static Sitting Balance Static Sitting -  Balance Support: No upper extremity supported;Feet supported Static Sitting - Level of Assistance: 5: Stand by assistance Dynamic Sitting Balance Dynamic Sitting - Balance Support: During functional activity Dynamic Sitting - Level of Assistance: 3: Mod assist Static Standing Balance Static Standing - Balance Support: During functional activity Static Standing - Level of Assistance: 4: Mod-max assist Dynamic Standing Balance Dynamic Standing - Balance Support: Left upper extremity supported;During functional activity Dynamic Standing - Level of Assistance: 2: Max assist Extremity Assessment  RLE Assessment RLE Assessment: Exceptions to [facility] RLE Strength RLE Overall Strength: Deficits RLE Overall Strength Comments: 1/5 throughout LLE Assessment LLE Assessment: Within Functional Limits  Recommendations for other services: None  Discharge Criteria: Patient will be discharged from PT if patient refuses treatment 3 consecutive times without medical reason, if treatment goals not met, if there is a change in medical status, if patient makes no progress towards goals or if patient is discharged from hospital.  The above assessment, treatment plan, treatment  alternatives and goals were discussed and mutually agreed upon: by patient  [name] 04/07/2011, 10:28 AM

## 2011-04-07 NOTE — Evaluation (Signed)
Occupational Therapy Assessment and Plan and Therapy Intervention  Patient Details  Name: Edward Choi MRN: [id number] Date of Birth: [birth year]  OT Diagnosis: abnormal posture and hemiplegia affecting dominant side Rehab Potential: Rehab Potential: Good ELOS: 3 weeks   Today's Date: 04/07/2011 Time: 0801-0901 Time Calculation (min): 60 min  Therapy Intervention:  Patient seen for initial evaluation and ADL retraining of bathing and toilet transfers and RUE neuromuscular facilitation.  Therapy focused on balance and use of RLE with transfers and contolled sit to stand.  Pt is demonstrating good attention to right side, no impulsivity, good safety awareness, and follows directions well.  Tapping and A/Arom used to facilitate motor movement in RUE.  Pt demonstrated trace scapular mobility of elevation and retraction.  Assessment & Plan Clinical Impression:   Patient transferred to CIR on 04/06/2011 .  Patient's past medical history is significant for HTN, acute MI, CAD, depression, back pain, CABG 29110.   61 year old right-handed [place] male with history of coronary artery disease with bypass grafting in 2006. Patient on no medications prior to hospital admission with questionable medical compliance. Patient independent prior to admission. Admitted January 21 with right-sided weakness. 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 MRA of the head with mild intracranial atherosclerotic type changes. Patient did not receive TPA. Echocardiogram with ejection fraction of 60% without emboli . Carotid Dopplers with no significant extracranial carotid artery stenosis.    Patient currently requires min with basic self-care skills secondary to abnormal tone and decreased sitting balance, decreased standing balance, decreased postural control and hemiplegia.  Prior to hospitalization, patient could complete basic ADLs independently..  Patient will benefit from skilled  intervention to increase independence with basic self-care skills prior to discharge home with care partner.  Anticipate patient will require intermittent supervision and follow up home health.  OT - End of Session Activity Tolerance: Tolerates 30+ min activity without fatigue OT Assessment Rehab Potential: Good Barriers to Discharge: None OT Plan OT Frequency: 1-2 X/day, 60-90 minutes;5 out of 7 days Estimated Length of Stay: 3 weeks OT Treatment/Interventions: Balance/vestibular training;DME/adaptive equipment instruction;Functional mobility training;Neuromuscular re-education;Patient/family education;Self Care/advanced ADL retraining;Therapeutic Activities;Therapeutic Exercise;UE/LE Strength taining/ROM;UE/LE Coordination activities OT Recommendation Follow Up Recommendations: Home health OT Equipment Recommended: Tub/shower bench  Precautions/Restrictions  Precautions Precautions: Fall Required Braces or Orthoses: No Restrictions Weight Bearing Restrictions: No General Chart Reviewed: Yes Family/Caregiver Present: No Vital Signs Therapy Vitals Temp: 98.3 F (36.8 C) Temp src: Oral Pulse Rate: 82  Resp: 16  BP: 146/89 mmHg Oxygen Therapy SpO2: 96 % O2 Device: None (Room air) Pain Pain Assessment Pain Assessment: No/denies pain Pain Score: 0-No pain Home Living/Prior Functioning Home Living Lives With: Spouse;Family Receives Help From: Family;Friend(s) Type of Home: House Home Layout: One level Home Access: Stairs to enter Entrance Stairs-Rails: Right;Left;Can reach both Entrance Stairs-Number of Steps: 3 Bathroom Shower/Tub: Forensic scientist: Standard Bathroom Accessibility: Yes How Accessible: Accessible via walker Home Adaptive Equipment: None Prior Function Level of Independence: Independent with basic ADLs;Independent with gait;Independent with transfers Able to Take Stairs?: Yes Driving: No Vocation:  Unemployed ADL ADL Grooming: Setup Where Assessed-Grooming: Sitting at sink Upper Body Bathing: Minimal assistance Where Assessed-Upper Body Bathing: Sitting at sink Lower Body Bathing: Minimal assistance Where Assessed-Lower Body Bathing: Standing at sink;Sitting at sink Upper Body Dressing: Minimal assistance (min assist to don hospital gowns) Lower Body Dressing: Not assessed Toileting: Not assessed Toilet Transfer: Minimal assistance Toilet Transfer Method: Stand pivot Toilet Transfer Equipment: Grab bars Tub/Shower  Transfer: Not assessed Film/video editor: Not assessed Vision/Perception  Vision - History Baseline Vision: No visual deficits Patient Visual Report: No change from baseline (pt states that he no longer has blurry vision) Vision - Assessment Eye Alignment: Within Functional Limits Ocular Range of Motion: Within Functional Limits Perception Perception: Within Functional Limits Praxis Praxis: Intact  Cognition Overall Cognitive Status: Appears within functional limits for tasks assessed [facility] for basic ADL tasks) Arousal/Alertness: Awake/alert Orientation Level: Oriented X4 Sensation Sensation Light Touch: Appears Intact Stereognosis: Not tested Hot/Cold: Appears Intact Coordination Gross Motor Movements are Fluid and Coordinated: No Fine Motor Movements are Fluid and Coordinated: No Coordination and Movement Description: RUE flaccid Motor  Motor Motor: Hemiplegia;Abnormal postural alignment and control Mobility  Transfers Sit to Stand: 4: Min assist Sit to Stand Details: Manual facilitation for weight shifting;Manual facilitation for placement;Manual facilitation for weight bearing Stand to Sit: 4: Min assist Stand to Sit Details (indicate cue type and reason): Manual facilitation for weight shifting;Manual facilitation for weight bearing;Manual facilitation for placement  Trunk/Postural Assessment  Kyphotic with muscle shortening in right  trunk    Balance Static Sitting Balance Static Sitting - Balance Support: No upper extremity supported;Feet supported Static Sitting - Level of Assistance: 5: Stand by assistance Dynamic Sitting Balance Dynamic Sitting - Balance Support: During functional activity Dynamic Sitting - Level of Assistance: 3: Mod assist Static Standing Balance Static Standing - Balance Support: During functional activity Static Standing - Level of Assistance: 4: Min assist Dynamic Standing Balance Dynamic Standing - Balance Support: During functional activity Dynamic Standing - Level of Assistance: 3: Mod assist Extremity/Trunk Assessment RUE Assessment RUE Assessment: Exceptions to [facility] RUE Tone RUE Tone: Hypotonic Hypotonic Details: slight active movement in scapular elevation and retraction LUE Assessment LUE Assessment: Within Functional Limits  Recommendations for other services: None  Discharge Criteria: Patient will be discharged from OT if patient refuses treatment 3 consecutive times without medical reason, if treatment goals not met, if there is a change in medical status, if patient makes no progress towards goals or if patient is discharged from hospital.  The above assessment, treatment plan, treatment alternatives and goals were discussed and mutually agreed upon: by patient  [facility] 04/07/2011, 9:24 AM

## 2011-04-07 NOTE — Progress Notes (Signed)
Patient information reviewed and entered into [facility] system by Sayre Mazor, RN, CRRN, PPS Coordinator.  Information including medical coding and functional independence measure will be reviewed and updated through discharge.    

## 2011-04-08 DIAGNOSIS — G8100 Flaccid hemiplegia affecting unspecified side: Secondary | ICD-10-CM

## 2011-04-08 DIAGNOSIS — Z5189 Encounter for other specified aftercare: Secondary | ICD-10-CM

## 2011-04-08 DIAGNOSIS — I6330 Cerebral infarction due to thrombosis of unspecified cerebral artery: Secondary | ICD-10-CM

## 2011-04-08 NOTE — Progress Notes (Signed)
Speech Language Pathology Therapy Note  Patient Details  Name: Edward Choi MRN: [id number] Date of Birth: May 15, 1950  Today's Date: 04/08/2011 Time: 8657-8469 Time Calculation (min): 30 min  Precautions: Precautions Precautions: Fall Precaution Comments: Dys 3 diet with thin liquids Required Braces or Orthoses: No Restrictions Weight Bearing Restrictions: No  Skilled Therapeutic Interventions: Treatment focus on speech intelligibility at the conversation level. Pt was ~90% intelligible throughout session, however, pt only spoke in short phrases (suspect due to language/cultural difference). Pt verbalized appropriate intellectual awareness of current physical impairments as well as appropriate safety and judgement. 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   Pain: No/Denies Pain  Therapy/Group: Individual Therapy  Edward Choi 04/08/2011 2:49 PM

## 2011-04-08 NOTE — Progress Notes (Signed)
Occupational Therapy Session Note  Patient Details  Name: Edward Choi MRN: [id number] Date of Birth: 1950/09/03  Today's Date: 04/08/2011 Time: 7829-5621 Time Calculation (min): 45 min  Precautions: Precautions Precautions: Fall Precaution Comments: Dys 3 diet with thin liquids Required Braces or Orthoses: No Restrictions Weight Bearing Restrictions: No  Short Term Goals: OT Short Term Goal 1: Pt will toilet with steady assist. OT Short Term Goal 2: Pt will don t shirt with verbal cues and set up. OT Short Term Goal 3: Pt will don pants with min assist. OT Short Term Goal 4: Pt will demonstrate improved dynamic sitting balance by bathing on tub seat with supervision. OT Short Term Goal 5: Pt will demonstrate self ROM exercises for RUE with minimal cues.  Skilled Therapeutic Interventions/Progress Updates:    Skilled OT addressed transfers to toilet, RUE neuromuscular reeducation, dynamic standing balance, postural control,  See fim for functional levels.   Pt.  Required manual assist with standing balance and cues to hold RLE.  He utilized RUE in weight bearing activities on stool  And with gravity eliminated.  He activated some shoulder flexion and extension with minimal assist and gravity eliminated.     pain:  none Therapy/Group: Individual Therapy  [name] 04/08/2011, 4:36 PM

## 2011-04-08 NOTE — Progress Notes (Signed)
Patient ID: Edward Choi, male   DOB: Sep 19, 1950, 61 y.o.   MRN: [id number] Patient ID: Edward Choi, male   DOB: 06/27/1950, 61 y.o.   MRN: [id number]  Subjective/Complaints: 1/26--no complaaints feeling well Review of Systems  Respiratory: Negative.   Cardiovascular: Negative.   Gastrointestinal: Positive for constipation.  Neurological: Positive for focal weakness.   Objective: Vital Signs: Blood pressure 141/93, pulse 92, temperature 98 F (36.7 C), temperature source Oral, resp. rate 16, SpO2 93.00%. No results found. Results for orders placed during the hospital encounter of 04/06/11 (from the past 72 hour(s))  MRSA PCR SCREENING     Status: Normal   Collection Time   04/06/11  5:11 PM      Component Value Range Comment   MRSA by PCR NEGATIVE  NEGATIVE    CBC     Status: Abnormal   Collection Time   04/07/11  5:45 AM      Component Value Range Comment   WBC 6.7  4.0 - 10.5 (K/uL)    RBC 6.77 (*) 4.22 - 5.81 (MIL/uL)    Hemoglobin 13.4  13.0 - 17.0 (g/dL)    HCT 78.2  95.6 - 21.3 (%)    MCV 58.8 (*) 78.0 - 100.0 (fL)    MCH 19.8 (*) 26.0 - 34.0 (pg)    MCHC 33.7  30.0 - 36.0 (g/dL)    RDW 08.6 (*) 57.8 - 15.5 (%)    Platelets 232  150 - 400 (K/uL)   COMPREHENSIVE METABOLIC PANEL     Status: Abnormal   Collection Time   04/07/11  5:45 AM      Component Value Range Comment   Sodium 139  135 - 145 (mEq/L)    Potassium 4.2  3.5 - 5.1 (mEq/L)    Chloride 105  96 - 112 (mEq/L)    CO2 23  19 - 32 (mEq/L)    Glucose, Bld 108 (*) 70 - 99 (mg/dL)    BUN 12  6 - 23 (mg/dL)    Creatinine, Ser 4.69  0.50 - 1.35 (mg/dL)    Calcium 8.8  8.4 - 10.5 (mg/dL)    Total Protein 7.4  6.0 - 8.3 (g/dL)    Albumin 3.3 (*) 3.5 - 5.2 (g/dL)    AST 33  0 - 37 (U/L)    ALT 30  0 - 53 (U/L)    Alkaline Phosphatase 81  39 - 117 (U/L)    Total Bilirubin 0.5  0.3 - 1.2 (mg/dL)    GFR calc non Af Amer 89 (*) >90 (mL/min)    GFR calc Af Amer >90  >90 (mL/min)   DIFFERENTIAL     Status:  Normal   Collection Time   04/07/11  5:45 AM      Component Value Range Comment   Neutrophils Relative 58  43 - 77 (%)    Lymphocytes Relative 29  12 - 46 (%)    Monocytes Relative 9  3 - 12 (%)    Eosinophils Relative 3  0 - 5 (%)    Basophils Relative 1  0 - 1 (%)    Neutro Abs 3.9  1.7 - 7.7 (K/uL)    Lymphs Abs 1.9  0.7 - 4.0 (K/uL)    Monocytes Absolute 0.6  0.1 - 1.0 (K/uL)    Eosinophils Absolute 0.2  0.0 - 0.7 (K/uL)    Basophils Absolute 0.1  0.0 - 0.1 (K/uL)    RBC Morphology TARGET CELLS  HENT:  Head: Normocephalic.  Neck: Normal range of motion. No thyromegaly present.  Cardiovascular: Normal rate and regular rhythm.  Pulmonary/Chest: Breath sounds normal. He has no wheezes.  Abdominal: He exhibits no distension. There is no tenderness.  Musculoskeletal: He exhibits no edema.  Neurological: He is alert.  Follows basic two-step motor commands. Answers simple questions. There is a language barrier but patient can communicate his needs. Patient with 1/5 strength intermittently throughout the right upper extremity. He is perhaps 1-1+ out of 5 right lower extremity with some motor planning issues. Is a right central 7 and tongue deviation. Speech is slightly less dysarthric. Sensation is right arm and leg Mildly diminished. Reflexes are 1+ throughout.  Skin: Skin is warm and dry.  Psychiatric: He has a normal mood and affect  CN central 7 on  Exam 1/26  Assessment/Plan: 1. Functional deficits secondary to L pontine infarct which require 3+ hours per day of interdisciplinary therapy in a comprehensive inpatient rehab setting. Physiatrist is providing close team supervision and 24 hour management of active medical problems listed below. Physiatrist and rehab team continue to assess barriers to discharge/monitor patient progress toward functional and medical goals. FIM: FIM - Bathing Bathing Steps Patient Completed: Chest;Right Arm;Abdomen;Front perineal area;Buttocks;Right  upper leg;Left upper leg;Right lower leg (including foot) Bathing: 4: Min-Patient completes 8-9 12f 10 parts or 75+ percent  FIM - Upper Body Dressing/Undressing Upper body dressing/undressing: 0: Wears gown/pajamas-no public clothing FIM - Lower Body Dressing/Undressing Lower body dressing/undressing: 0: Wears gown/pajamas-no public clothing  FIM - Toileting Toileting steps completed by patient: Adjust clothing prior to toileting Toileting Assistive Devices: Grab bar or rail for support Toileting: 0: Activity did not occur  FIM - Diplomatic Services operational officer Devices: Grab bars Toilet Transfers: 4-To toilet/BSC: Min A (steadying Pt. > 75%)  FIM - Banker Devices:  (HHA) Bed/Chair Transfer: 1: Supine > Sit: Total A (helper does all/Pt. < 25%);3: Sit > Supine: Mod A (lifting assist/Pt. 50-74%/lift 2 legs);1: Bed > Chair or W/C: Total A (helper does all/Pt. < 25%);1: Chair or W/C > Bed: Total A (helper does all/Pt. < 25%)  FIM - Locomotion: Wheelchair Distance: 150 feet; educated on use of LUE for w/c parts management.  Performed w/c mobility on level surface with use of LLE for propulsion with manual facilitation for initiation and sequence. Locomotion: Wheelchair: 1: Travels less than 50 ft with maximal assistance (Pt: 25 - 49%) FIM - Locomotion: Ambulation Locomotion: Ambulation Assistive Devices: Cane - Quad;Other (comment) (L HHA on evaluation) Ambulation/Gait Assistance: 1: +1 Total assist Locomotion: Ambulation: 1: Travels 50 - 149 ft with total assistance/helper does all (Pt.< 25%)  Comprehension Comprehension Mode: Auditory Comprehension: 5-Understands complex 90% of the time/Cues < 10% of the time  Expression Expression Mode: Verbal Expression: 5-Expresses complex 90% of the time/cues < 10% of the time  Social Interaction Social Interaction: 5-Interacts appropriately 90% of the time - Needs monitoring or encouragement  for participation or interaction.  Problem Solving Problem Solving Mode: Not assessed Problem Solving: 5-Solves complex 90% of the time/cues < 10% of the time  Memory Memory: 5-Recognizes or recalls 90% of the time/requires cueing < 10% of the time  2. Anticoagulation/DVT prophylaxis with Pharmaceutical: Lovenox 3. Pain Management: Tylenol  Medical Problem List and Plan:  1. left paracentral pontine infarction. Continue aspirin therapy  2. DVT Prophylaxis/Anticoagulation: Subcutaneous Lovenox. Following platelets. 3. Dysphagia. Regular diet/ thin liquids. Tolerating well.  4. Hyperlipidemia. Crestor  5. Hypertension. Currently  on no antihypertensive medications. Will monitor the increased activity  6. Coronary artery disease. CABG 06. Continue aspirin  7. Medical noncompliance. Provide counseling   LOS (Days) 2 A FACE TO FACE EVALUATION WAS PERFORMED  Magdalena Skilton T 04/08/2011, 8:36 AM

## 2011-04-09 NOTE — Progress Notes (Signed)
Subjective/Complaints: 1/27- no new prolbems Review of Systems  Respiratory: Negative.   Cardiovascular: Negative.   Gastrointestinal: Positive for constipation.  Neurological: Positive for focal weakness.   Objective: Vital Signs: Blood pressure 141/93, pulse 92, temperature 98 F (36.7 C), temperature source Oral, resp. rate 16, SpO2 93.00%. No results found. Results for orders placed during the hospital encounter of 04/06/11 (from the past 72 hour(s))  MRSA PCR SCREENING     Status: Normal   Collection Time   04/06/11  5:11 PM      Component Value Range Comment   MRSA by PCR NEGATIVE  NEGATIVE    CBC     Status: Abnormal   Collection Time   04/07/11  5:45 AM      Component Value Range Comment   WBC 6.7  4.0 - 10.5 (K/uL)    RBC 6.77 (*) 4.22 - 5.81 (MIL/uL)    Hemoglobin 13.4  13.0 - 17.0 (g/dL)    HCT 45.4  09.8 - 11.9 (%)    MCV 58.8 (*) 78.0 - 100.0 (fL)    MCH 19.8 (*) 26.0 - 34.0 (pg)    MCHC 33.7  30.0 - 36.0 (g/dL)    RDW 14.7 (*) 82.9 - 15.5 (%)    Platelets 232  150 - 400 (K/uL)   COMPREHENSIVE METABOLIC PANEL     Status: Abnormal   Collection Time   04/07/11  5:45 AM      Component Value Range Comment   Sodium 139  135 - 145 (mEq/L)    Potassium 4.2  3.5 - 5.1 (mEq/L)    Chloride 105  96 - 112 (mEq/L)    CO2 23  19 - 32 (mEq/L)    Glucose, Bld 108 (*) 70 - 99 (mg/dL)    BUN 12  6 - 23 (mg/dL)    Creatinine, Ser 5.62  0.50 - 1.35 (mg/dL)    Calcium 8.8  8.4 - 10.5 (mg/dL)    Total Protein 7.4  6.0 - 8.3 (g/dL)    Albumin 3.3 (*) 3.5 - 5.2 (g/dL)    AST 33  0 - 37 (U/L)    ALT 30  0 - 53 (U/L)    Alkaline Phosphatase 81  39 - 117 (U/L)    Total Bilirubin 0.5  0.3 - 1.2 (mg/dL)    GFR calc non Af Amer 89 (*) >90 (mL/min)    GFR calc Af Amer >90  >90 (mL/min)   DIFFERENTIAL     Status: Normal   Collection Time   04/07/11  5:45 AM      Component Value Range Comment   Neutrophils Relative 58  43 - 77 (%)    Lymphocytes Relative 29  12 - 46 (%)    Monocytes Relative 9  3 - 12 (%)    Eosinophils Relative 3  0 - 5 (%)    Basophils Relative 1  0 - 1 (%)    Neutro Abs 3.9  1.7 - 7.7 (K/uL)    Lymphs Abs 1.9  0.7 - 4.0 (K/uL)    Monocytes Absolute 0.6  0.1 - 1.0 (K/uL)    Eosinophils Absolute 0.2  0.0 - 0.7 (K/uL)    Basophils Absolute 0.1  0.0 - 0.1 (K/uL)    RBC Morphology TARGET CELLS      HENT:  Head: Normocephalic.  Neck: Normal range of motion. No thyromegaly present.  Cardiovascular: Normal rate and regular rhythm.  Pulmonary/Chest: Breath sounds normal. He has no wheezes.  Abdominal:  He exhibits no distension. There is no tenderness.  Musculoskeletal: He exhibits no edema.  Neurological: He is alert.  Follows basic two-step motor commands. Answers simple questions. There is a language barrier but patient can communicate his needs. Patient with 1/5 strength intermittently throughout the right upper extremity. He is perhaps 1-1+ out of 5 right lower extremity with some motor planning issues. Is a right central 7 and tongue deviation. Speech is slightly less dysarthric. Sensation is right arm and leg Mildly diminished. Reflexes are 1+ throughout.  Skin: Skin is warm and dry.  Psychiatric: He has a normal mood and affect  CN central 7 on  Exam 1/27  Assessment/Plan: 1. Functional deficits secondary to L pontine infarct which require 3+ hours per day of interdisciplinary therapy in a comprehensive inpatient rehab setting. Physiatrist is providing close team supervision and 24 hour management of active medical problems listed below. Physiatrist and rehab team continue to assess barriers to discharge/monitor patient progress toward functional and medical goals. FIM: FIM - Bathing Bathing Steps Patient Completed: Chest;Right Arm;Abdomen;Front perineal area;Buttocks;Right upper leg;Left upper leg;Right lower leg (including foot) Bathing: 4: Min-Patient completes 8-9 35f 10 parts or 75+ percent  FIM - Upper Body  Dressing/Undressing Upper body dressing/undressing: 0: Wears gown/pajamas-no public clothing FIM - Lower Body Dressing/Undressing Lower body dressing/undressing: 0: Wears gown/pajamas-no public clothing  FIM - Toileting Toileting steps completed by patient: Adjust clothing prior to toileting Toileting Assistive Devices: Grab bar or rail for support Toileting: 0: Activity did not occur  FIM - Diplomatic Services operational officer Devices: Grab bars Toilet Transfers: 4-To toilet/BSC: Min A (steadying Pt. > 75%)  FIM - Banker Devices:  (HHA) Bed/Chair Transfer: 1: Supine > Sit: Total A (helper does all/Pt. < 25%);3: Sit > Supine: Mod A (lifting assist/Pt. 50-74%/lift 2 legs);1: Bed > Chair or W/C: Total A (helper does all/Pt. < 25%);1: Chair or W/C > Bed: Total A (helper does all/Pt. < 25%)  FIM - Locomotion: Wheelchair Distance: 150 feet; educated on use of LUE for w/c parts management.  Performed w/c mobility on level surface with use of LLE for propulsion with manual facilitation for initiation and sequence. Locomotion: Wheelchair: 1: Travels less than 50 ft with maximal assistance (Pt: 25 - 49%) FIM - Locomotion: Ambulation Locomotion: Ambulation Assistive Devices: Cane - Quad;Other (comment) (L HHA on evaluation) Ambulation/Gait Assistance: 1: +1 Total assist Locomotion: Ambulation: 1: Travels 50 - 149 ft with total assistance/helper does all (Pt.< 25%)  Comprehension Comprehension Mode: Auditory Comprehension: 5-Understands complex 90% of the time/Cues < 10% of the time  Expression Expression Mode: Verbal Expression: 5-Expresses complex 90% of the time/cues < 10% of the time  Social Interaction Social Interaction: 5-Interacts appropriately 90% of the time - Needs monitoring or encouragement for participation or interaction.  Problem Solving Problem Solving Mode: Not assessed Problem Solving: 5-Solves complex 90% of the time/cues  < 10% of the time  Memory Memory: 5-Recognizes or recalls 90% of the time/requires cueing < 10% of the time  2. Anticoagulation/DVT prophylaxis with Pharmaceutical: Lovenox 3. Pain Management: Tylenol  Medical Problem List and Plan:  1. left paracentral pontine infarction. Continue aspirin therapy  2. DVT Prophylaxis/Anticoagulation: Subcutaneous Lovenox. Following platelets. 3. Dysphagia. Regular diet/ thin liquids. Tolerating well.  4. Hyperlipidemia. Crestor  5. Hypertension. Currently on no antihypertensive medications. Will monitor the increased activity  6. Coronary artery disease. CABG 06. Continue aspirin  7. Medical noncompliance. Provide counseling   LOS (Days) 2 A FACE  TO FACE EVALUATION WAS PERFORMED  Edward Choi T 1/27

## 2011-04-09 NOTE — Progress Notes (Signed)
Occupational Therapy Note  Patient Details  Name: Edward Choi MRN: [id number] Date of Birth: [birth year] Today's Date: 04/09/2011 Time:  1300-1330  (30 mins) Individual Treatment Pain:  None  Engaged in neuromuscular re education to RUE using PNF patterns, weight bearing, grasp and release.  Pt demonstrated good control in weight bearing activities and starting to get some finger flexion.  No thumb active movements notes.    [name] 04/09/2011, 5:38 PM

## 2011-04-09 NOTE — Progress Notes (Signed)
Physical Therapy Session Note  Patient Details  Name: Witt Plitt MRN: [id number] Date of Birth: [birth year]  Today's Date: 04/09/2011 Time: 4098-1191 Time Calculation (min): 45 min  Precautions: Precautions Precautions: Fall Precaution Comments: Dys 3 diet with thin liquids Required Braces or Orthoses: No Restrictions Weight Bearing Restrictions: No  Short Term Goals: PT Short Term Goal 1: Patient will perform bed mobility flat bed to L and R with consistent min A with minimal verbal and tactile cues. PT Short Term Goal 2: Patient will perform bed  <> chair transfers to L and R with consistent min A with intermittent verbal and tactile cues. PT Short Term Goal 3: Patient will perform w/c mobility on unit with L hemi technique with min A PT Short Term Goal 4: Patient will perform gait training x 100' with LRAD and mod A. PT Short Term Goal 5: Patient will perform up and down 3 stairs with one rail and mod A for home entry/exit.  Pain  Denies pain.  Pt's wife present to observe session. Recommended to wife to bring in shoes for pt to wear during therapy, in agreement. W/c propulsion and parts management education requiring overall min/mod A...improved technique from AM session, through still difficulty using hemi-technique (shoes would provide better traction for steering). Therapeutic activity at table top to complete a simple puzzle for cognitive challenge and for weightbearing through RLE and RUE in standing position, up to 5 minutes in standing with therapist providing manual facilitation at hip and knee for extension while maintaining standing position. Pt required mod-max A with puzzle for color matching and puzzle piece placement. Pt stated this activity was challenging for him to match the pieces. Min A stand pivot w/c <-> recliner in pt room.     Therapy/Group: Individual Therapy  Karolee Stamps [facility] 04/09/2011, 4:18 PM

## 2011-04-09 NOTE — Progress Notes (Signed)
Physical Therapy Session Note  Patient Details  Name: Edward Choi MRN: [id number] Date of Birth: [birth year]  Today's Date: 04/09/2011 Time: 1000-1055 Time Calculation (min): 55 min  Precautions: Precautions Precautions: Fall Precaution Comments: Dys 3 diet with thin liquids Required Braces or Orthoses: No Restrictions Weight Bearing Restrictions: No  Short Term Goals: PT Short Term Goal 1: Patient will perform bed mobility flat bed to L and R with consistent min A with minimal verbal and tactile cues. PT Short Term Goal 2: Patient will perform bed  <> chair transfers to L and R with consistent min A with intermittent verbal and tactile cues. PT Short Term Goal 3: Patient will perform w/c mobility on unit with L hemi technique with min A PT Short Term Goal 4: Patient will perform gait training x 100' with LRAD and mod A. PT Short Term Goal 5: Patient will perform up and down 3 stairs with one rail and mod A for home entry/exit.      Pain Pain Assessment Pain Score: 0-No pain    Min A stand pivot from recliner <-> w/c, cueing for technique/hand placement. W/c propulsion with moderate assist in controlled environment to/from gym - difficulty coordinating using L arm and L leg to propel despite manual and verbal cueing for technique. Lower extremity therex for strengthening and ROM gravity eliminated and PROM/AAROM against gravity for RLE, LLE AROM for heel slides, hip abduction/adduction, SAQ, and bridging x 10 reps each bilaterally. Standing activity with tabletop to promote RUE and RLE weightbearing during activity to move rings to opposite side with LUE to promote weightshifting in both directions, min A overall for maintaining balance and blocking RLE, tactile cueing to promote R hip/knee extension in standing. Neuro -re-ed for gait training with quad cane with manual facilitation at hips for RLE swing, cueing for co-contraction at R hip in stance min/mod A overall 25' x 2.    Therapy/Group: Individual Therapy  Karolee Stamps [facility] 04/09/2011, 10:58 AM

## 2011-04-09 NOTE — Progress Notes (Signed)
Occupational Therapy Session Note  Patient Details  Name: Edward Choi MRN: [id number] Date of Birth: Oct 16, 1950  Today's Date: 04/09/2011 Time: 0800-0905 Time Calculation (min): 65 min  Precautions: Precautions Precautions: Fall Precaution Comments: Dys 3 diet with thin liquids Required Braces or Orthoses: No Restrictions Weight Bearing Restrictions: No  Short Term Goals: OT Short Term Goal 1: Pt will toilet with steady assist. OT Short Term Goal 2: Pt will don t shirt with verbal cues and set up. OT Short Term Goal 3: Pt will don pants with min assist. OT Short Term Goal 4: Pt will demonstrate improved dynamic sitting balance by bathing on tub seat with supervision. OT Short Term Goal 5: Pt will demonstrate self ROM exercises for RUE with minimal cues.  Skilled Therapeutic Interventions/Progress Updates:    Addressed therapeutic bathing and dressing at shower level.  Pt. Used wc to roll into bathroom with moderate assist going in straight pattern and pulling with left LE.  He sat on tub bench to bath.  Tub bench tilted forward when pt was leaning to wash lower legs.  OT customized bench to ensure better safety in shower with dynamic balance tasks.    Finished session with functional exercises.  See below.     Pain Pain Assessment Pain Score: 0-No pain ADL ADL Grooming: obtain items Where Assessed-Grooming: Sitting at sink Upper Body Bathing: moderatel assistance Where Assessed-Upper Body Bathing: Sitting in shower Lower Body Bathing: Minimal assistance Where Assessed-Lower Body Bathing:shower with tub bench Upper Body Dressing: Minimal assistance (min assist to don hospital gowns) Lower Body Dressing:no clothes available.  Pt donned/doffed footies.   Walk-In Shower Transfer: min assist  Other Treatments  Performed RUE neuromuscular re education with weight bearing during ADL, , movements with gravity eliminated in shoulder flexion and extension.  Pt needs physical cues  to use arm muscles and not trunk.  Addressed mobility in room using quad cane.  Pt was cued to keep right side forward when walking.    Therapy/Group: Individual Therapy  [name] 04/09/2011, 9:17 AM

## 2011-04-10 NOTE — Progress Notes (Signed)
Patient ID: Edward Choi, male   DOB: [birth year], 61 y.o.   MRN: [id number]  Subjective/Complaints: 1/27- no new prolbems Review of Systems  Respiratory: Negative.   Cardiovascular: Negative.   Gastrointestinal: Positive for constipation.  Neurological: Positive for focal weakness.   Objective: Vital Signs: Blood pressure 141/93, pulse 92, temperature 98 F (36.7 C), temperature source Oral, resp. rate 16, SpO2 93.00%. No results found. Results for orders placed during the hospital encounter of 04/06/11 (from the past 72 hour(s))  MRSA PCR SCREENING     Status: Normal   Collection Time   04/06/11  5:11 PM      Component Value Range Comment   MRSA by PCR NEGATIVE  NEGATIVE    CBC     Status: Abnormal   Collection Time   04/07/11  5:45 AM      Component Value Range Comment   WBC 6.7  4.0 - 10.5 (K/uL)    RBC 6.77 (*) 4.22 - 5.81 (MIL/uL)    Hemoglobin 13.4  13.0 - 17.0 (g/dL)    HCT 13.0  86.5 - 78.4 (%)    MCV 58.8 (*) 78.0 - 100.0 (fL)    MCH 19.8 (*) 26.0 - 34.0 (pg)    MCHC 33.7  30.0 - 36.0 (g/dL)    RDW 69.6 (*) 29.5 - 15.5 (%)    Platelets 232  150 - 400 (K/uL)   COMPREHENSIVE METABOLIC PANEL     Status: Abnormal   Collection Time   04/07/11  5:45 AM      Component Value Range Comment   Sodium 139  135 - 145 (mEq/L)    Potassium 4.2  3.5 - 5.1 (mEq/L)    Chloride 105  96 - 112 (mEq/L)    CO2 23  19 - 32 (mEq/L)    Glucose, Bld 108 (*) 70 - 99 (mg/dL)    BUN 12  6 - 23 (mg/dL)    Creatinine, Ser 2.84  0.50 - 1.35 (mg/dL)    Calcium 8.8  8.4 - 10.5 (mg/dL)    Total Protein 7.4  6.0 - 8.3 (g/dL)    Albumin 3.3 (*) 3.5 - 5.2 (g/dL)    AST 33  0 - 37 (U/L)    ALT 30  0 - 53 (U/L)    Alkaline Phosphatase 81  39 - 117 (U/L)    Total Bilirubin 0.5  0.3 - 1.2 (mg/dL)    GFR calc non Af Amer 89 (*) >90 (mL/min)    GFR calc Af Amer >90  >90 (mL/min)   DIFFERENTIAL     Status: Normal   Collection Time   04/07/11  5:45 AM      Component Value Range Comment   Neutrophils Relative 58  43 - 77 (%)    Lymphocytes Relative 29  12 - 46 (%)    Monocytes Relative 9  3 - 12 (%)    Eosinophils Relative 3  0 - 5 (%)    Basophils Relative 1  0 - 1 (%)    Neutro Abs 3.9  1.7 - 7.7 (K/uL)    Lymphs Abs 1.9  0.7 - 4.0 (K/uL)    Monocytes Absolute 0.6  0.1 - 1.0 (K/uL)    Eosinophils Absolute 0.2  0.0 - 0.7 (K/uL)    Basophils Absolute 0.1  0.0 - 0.1 (K/uL)    RBC Morphology TARGET CELLS      HENT:  Head: Normocephalic.  Neck: Normal range of motion. No thyromegaly present.  Cardiovascular:  Normal rate and regular rhythm.  Pulmonary/Chest: Breath sounds normal. He has no wheezes.  Abdominal: He exhibits no distension. There is no tenderness.  Musculoskeletal: He exhibits no edema.  Neurological: He is alert.  Follows basic two-step motor commands. Answers simple questions. There is a language barrier but patient can communicate his needs. Patient with 1/5 strength intermittently throughout the right upper extremity. He is perhaps 1-1+ out of 5 right lower extremity . Is a right central 7 and tongue deviation. Speech is slightly less dysarthric. Sensation is right arm and leg Mildly diminished. Reflexes are 1+ throughout.  Skin: Skin is warm and dry.  Psychiatric: He has a normal mood and affect  CN central 7 on   Assessment/Plan: 1. Functional deficits secondary to L pontine infarct which require 3+ hours per day of interdisciplinary therapy in a comprehensive inpatient rehab setting. Physiatrist is providing close team supervision and 24 hour management of active medical problems listed below. Physiatrist and rehab team continue to assess barriers to discharge/monitor patient progress toward functional and medical goals. FIM: FIM - Bathing Bathing Steps Patient Completed: Chest;Right Arm;Abdomen;Front perineal area;Buttocks;Right upper leg;Left upper leg;Right lower leg (including foot) Bathing: 4: Min-Patient completes 8-9 29f 10 parts or 75+  percent  FIM - Upper Body Dressing/Undressing Upper body dressing/undressing: 0: Wears gown/pajamas-no public clothing FIM - Lower Body Dressing/Undressing Lower body dressing/undressing: 0: Wears gown/pajamas-no public clothing  FIM - Toileting Toileting steps completed by patient: Adjust clothing prior to toileting Toileting Assistive Devices: Grab bar or rail for support Toileting: 0: Activity did not occur  FIM - Diplomatic Services operational officer Devices: Grab bars Toilet Transfers: 4-To toilet/BSC: Min A (steadying Pt. > 75%)  FIM - Banker Devices:  (HHA) Bed/Chair Transfer: 1: Supine > Sit: Total A (helper does all/Pt. < 25%);3: Sit > Supine: Mod A (lifting assist/Pt. 50-74%/lift 2 legs);1: Bed > Chair or W/C: Total A (helper does all/Pt. < 25%);1: Chair or W/C > Bed: Total A (helper does all/Pt. < 25%)  FIM - Locomotion: Wheelchair Distance: 150 feet; educated on use of LUE for w/c parts management.  Performed w/c mobility on level surface with use of LLE for propulsion with manual facilitation for initiation and sequence. Locomotion: Wheelchair: 1: Travels less than 50 ft with maximal assistance (Pt: 25 - 49%) FIM - Locomotion: Ambulation Locomotion: Ambulation Assistive Devices: Cane - Quad;Other (comment) (L HHA on evaluation) Ambulation/Gait Assistance: 1: +1 Total assist Locomotion: Ambulation: 1: Travels 50 - 149 ft with total assistance/helper does all (Pt.< 25%)  Comprehension Comprehension Mode: Auditory Comprehension: 5-Understands complex 90% of the time/Cues < 10% of the time  Expression Expression Mode: Verbal Expression: 5-Expresses complex 90% of the time/cues < 10% of the time  Social Interaction Social Interaction: 5-Interacts appropriately 90% of the time - Needs monitoring or encouragement for participation or interaction.  Problem Solving Problem Solving Mode: Not assessed Problem Solving: 5-Solves  complex 90% of the time/cues < 10% of the time  Memory Memory: 5-Recognizes or recalls 90% of the time/requires cueing < 10% of the time  2. Anticoagulation/DVT prophylaxis with Pharmaceutical: Lovenox 3. Pain Management: Tylenol  Medical Problem List and Plan:  1. left paracentral pontine infarction. Continue aspirin therapy  2. DVT Prophylaxis/Anticoagulation: Subcutaneous Lovenox. Following platelets. 3. Dysphagia. Regular diet/ thin liquids. Tolerating well.  4. Hyperlipidemia. Crestor  5. Hypertension. Currently on no antihypertensive medications. Will monitor the increased activity  6. Coronary artery disease. CABG 06. Continue aspirin  7. Medical  noncompliance. Provide counseling   LOS (Days) 2 A FACE TO FACE EVALUATION WAS PERFORMED  SWARTZ,ZACHARY T 1/27

## 2011-04-10 NOTE — Progress Notes (Signed)
Social Work Patient ID: Edward Choi, male   DOB: 10-29-1950, 61 y.o.   MRN: [id number]  Met with pt and wife when here to observe in therapies.  She reports her job will end in June due to moving to  [place].  FMLA forms given to Dan-PA to complete.  Discussed services he is eligible for.  Wife  Had questions regarding CAPS and Medicaid Have given wife a Medicaid application to complete. Pt Mother in-law is there but can only provide supervision level not any physical care. Continue to work on discharge plans.

## 2011-04-10 NOTE — Progress Notes (Signed)
Physical Therapy Session Note  Patient Details  Name: Edward Choi MRN: [id number] Date of Birth: 1950/11/20  Today's Date: 04/10/2011 Time: 1053-1201 Time Calculation (min): 68 min  Precautions: Precautions Precautions: Fall Precaution Comments: Dys 3 diet with thin liquids Required Braces or Orthoses: No Restrictions Weight Bearing Restrictions: No  Short Term Goals: PT Short Term Goal 1: Patient will perform bed mobility flat bed to L and R with consistent min A with minimal verbal and tactile cues. PT Short Term Goal 2: Patient will perform bed  <> chair transfers to L and R with consistent min A with intermittent verbal and tactile cues. PT Short Term Goal 3: Patient will perform w/c mobility on unit with L hemi technique with min A PT Short Term Goal 4: Patient will perform gait training x 100' with LRAD and mod A. PT Short Term Goal 5: Patient will perform up and down 3 stairs with one rail and mod A for home entry/exit.  Pain Pain Assessment Pain Assessment: No/denies pain Pain Score: 0-No pain  Locomotion  Ambulation Ambulation: Yes Ambulation/Gait Assistance: 1: +1 Total assist Ambulation Distance (Feet): 50 Feet Assistive device: Rolling walker;Large base quad cane;1 person hand held assist Ambulation/Gait Assistance Details: Manual facilitation for weight shifting;Manual facilitation for placement;Manual facilitation for weight bearing;Verbal cues for gait pattern;Verbal cues for sequencing;Tactile cues for posture;Tactile cues for placement Ambulation/Gait Assistance Details (indicate cue type and reason): Gait assessment with RW and use of RUE hand splint x 50' still with manual facilitation at trunk to prevent forward and lateral lean, RLE advancement, placement, ER and activation of RLE extensors at initial stance and for anterior translation of COG over RLE for full step length LLE.  Patient reported increased difficulty with using RW secondary to RUE weakness  and difficulty advancing RW.  Nurse tech training for ambulation in room with use of HHA on LUE or use of cane in LUE and assistance on R side with special considerations to safety with RUE, safety concerns with R foot drag.   Stairs / Additional Locomotion Stairs: Yes Stairs Assistance: 2: Max Estate agent Assistance Details: Tactile cues for initiation;Tactile cues for sequencing;Tactile cues for weight shifting;Tactile cues for placement;Tactile cues for weight beaing;Visual cues/gestures for precautions/safety;Visual cues/gestures for sequencing;Verbal cues for sequencing;Verbal cues for technique;Verbal cues for precautions/safety;Verbal cues for gait pattern Stairs Assistance Details (indicate cue type and reason): Wife states that main entrance to house has 2 steps to enter and no rails; practiced up and down 2 steps x 2 reps with cane in LUE and max A for sequence, lateral weight shifting, RLE advancement and placement and stabilization of RLE and trunk in stance to advance LLE.   Stair Management Technique: No rails;With cane;Step to pattern;Forwards Number of Stairs: 4  Height of Stairs: 6  Wheelchair Mobility Wheelchair Mobility: No Distance: 150   Other Treatments Treatments Neuromuscular Facilitation: Right;Lower Extremity;Activity to increase timing and sequencing;Activity to increase sustained activation;Activity to increase lateral weight shifting;Activity to increase anterior-posterior weight shifting for carryover to stairs and gait with manual facilitation at RLE for ER and extension with pelvic anterior rotation and weight shift over RLE to advance LLE up onto 4in step or over low obstacle for visual cue for full step length  Therapy/Group: Individual Therapy  [name] 04/10/2011, 1:18 PM

## 2011-04-10 NOTE — Progress Notes (Signed)
Progress Notes  Speech Language Pathology Therapy Note  Patient Details  Name: Edward Choi MRN: [id number] Date of Birth: June 16, 1950  Today's Date: 04/10/2011 Time: 1335-1400 Time Calculation (min): 25 min  Precautions: Precautions Precautions: Fall Precaution Comments: Dys 3 diet with thin liquids Required Braces or Orthoses: No Restrictions Weight Bearing Restrictions: No  Short Term Goals: set 04/07/11 1. Patient will consume regular textures and thin liquids without observed clinical signs of aspiration with: Modified independent assistance  2. Patient will utilize recommended strategies during swallow to increase swallowing safety with: Modified independent assistance 3. Patient will self monitor intelligibility during conversational speech and correct with use of compensatory strateies with modified independence.  Skilled Therapeutic Interventions/Progress Updates: Session focused on diagnostic treatment of swallow function with regular textures and trials of thin liquids via straw.  SLP cued patient to consume consecutive sips via straw however, patient reported that he only wanted to take small sips.  No overt s/s of aspiration observed and after trials patient stated that he preferred to drink from a cup and would not use the straw any way.  Wife present for session and states that he had not difficulty chewing his food at lunch.  Both wife and patient said that his speech sounds different after his stroke but that she can understand him most of the time and if not then she asks him to say it in [place].  Educated both of them on oral motor exercises that address right sided labial and lingual weakness and patient able to return demonstration.    Pain Pain Assessment Pain Assessment: No/denies pain Pain Score: 0-No pain  Therapy/Group: Individual Therapy  Charlane Ferretti., CCC-SLP [phone]  Maanya Hippert 04/10/2011 5:21 PM

## 2011-04-10 NOTE — Progress Notes (Signed)
R arm flaccid. Transferred from toilet to w/c with Min A. Remains continent of bowel and bladder. Last bowel movement, 04/10/11 extra-large, soft, in toilet. Independent with urnial. Requires staff to remove and empty. No c/o of pain this shift. Able to make needs known, and follows direction despite English being pt's secondary language.  Bed alarm and quick release in w/c for safety. No unsafe behavior exhibited. Wife in room supportive.

## 2011-04-10 NOTE — Progress Notes (Signed)
Occupational Therapy Session Note  Patient Details  Name: Edward Choi MRN: [id number] Date of Birth: [birth year]  Today's Date: 04/10/2011  1st Session: Time: 7829-5621 Time Calculation (min): 75 min  2nd Session: Time: 1510-1540 Time Calculation (min): 30 min  Precautions: Precautions Precautions: Fall Precaution Comments:  Required Braces or Orthoses: No Weight Bearing Restrictions: No  Short Term Goals: OT Short Term Goal 1: Pt will toilet with steady assist. OT Short Term Goal 2: Pt will don t shirt with verbal cues and set up. OT Short Term Goal 3: Pt will don pants with min assist. OT Short Term Goal 4: Pt will demonstrate improved dynamic sitting balance by bathing on tub seat with supervision. OT Short Term Goal 5: Pt will demonstrate self ROM exercises for RUE with minimal cues.   Ist Session: Skilled Therapeutic Interventions:  Self care retraining to include shower, groom, RUE and RLE neuromuscular reeducation, patient and wife education.  Focus session on slowing down, normal movement patterns, and quality movement during all self care tasks and functional mobility. Also, practice using RUE as a stabilizer with toothbrushing task and to push down to lock brakes once RUE is positioned on the brake.  Wife works and present to observe therapy today.  No questions or concerns stated at this time except wanted to talk to SW.  SW notified.  Therapy/Group: Individual Therapy  2nd Session: Skilled Therapeutic Interventions:  Session included W/C<>tub bench transfers, sit<>stand, W/C<>recliner transfers.  Practiced (dry run) tub bench transfers in the therapy apartment W/C<>tub bench with stand pivot at mod assist to include facilitate RLE.  Sit<>stand facilitating increased use of and weight bearing through RLE/RUE secondary to patient over uses LLE/LUE for mobility.  Patient requested to transfer to recliner at end of session.  Therapy/Group: Individual  Therapy  Roquel Burgin 04/10/2011, 4:16 PM

## 2011-04-11 DIAGNOSIS — I6330 Cerebral infarction due to thrombosis of unspecified cerebral artery: Secondary | ICD-10-CM

## 2011-04-11 DIAGNOSIS — Z5189 Encounter for other specified aftercare: Secondary | ICD-10-CM

## 2011-04-11 DIAGNOSIS — G8100 Flaccid hemiplegia affecting unspecified side: Secondary | ICD-10-CM

## 2011-04-11 NOTE — Progress Notes (Addendum)
Physical Therapy Session Note  Patient Details  Name: Edward Choi MRN: [id number] Date of Birth: 01-12-1951  Today's Date: 04/11/2011 Time:[id number] and  9562-1308 Time Calculation (min): 55 min and 41 min  Precautions: Precautions Precautions: Fall Precaution Comments: Regular diet , thin liquieds, no straw Required Braces or Orthoses: No Restrictions Weight Bearing Restrictions: No  Short Term Goals: PT Short Term Goal 1: Patient will perform bed mobility flat bed to L and R with consistent min A with minimal verbal and tactile cues. PT Short Term Goal 2: Patient will perform bed  <> chair transfers to L and R with consistent min A with intermittent verbal and tactile cues. PT Short Term Goal 3: Patient will perform w/c mobility on unit with L hemi technique with min A PT Short Term Goal 4: Patient will perform gait training x 100' with LRAD and mod A. PT Short Term Goal 5: Patient will perform up and down 3 stairs with one rail and mod A for home entry/exit.  Pain  No c/o pain  Locomotion Pre gait activity with RUE supported during LLE stepping over low obstacle forwards and backwards with manual facilitation on RLE for sustained activation of extensors and hip stabilizer muscles and anterior weight shift in stance; R and L lateral stepping for lateral weight shifting with UE support on therapist's shoulders with manual facilitation at RLE for advancement, placement, ER of hip and sustained activation of glutes and quad for stabilization in stance.  Ambulation Ambulation: Yes Ambulation/Gait Assistance: 2: Max assist with quad cane x 25' on level surface with manual facilitation on R side for trunk elongation, R advancement, placement and activation of extensors at initial stance and forward translation over RLE in midstance. Stairs / Additional Locomotion Stairs: Yes Stairs Assistance: 2: Max Estate agent Assistance Details: Tactile cues for initiation;Tactile cues for  sequencing;Tactile cues for weight shifting;Tactile cues for posture;Tactile cues for placement;Visual cues for safe use of DME/AE;Visual cues/gestures for precautions/safety;Visual cues/gestures for sequencing;Verbal cues for sequencing;Verbal cues for technique;Verbal cues for precautions/safety;Verbal cues for gait pattern;Verbal cues for safe use of DME/AE Stairs Assistance Details (indicate cue type and reason): Reviewed sequence for up and down 2 tall steps without rails but with cane in LUE and max A for sequence, lateral weight shifting, RLE advancement and placement and stabilization of RLE and trunk in stance to advance LLE. Stair Management Technique: No rails;Step to pattern;Forwards;With cane Number of Stairs: 2  Height of Stairs: 6  Wheelchair Mobility Wheelchair Mobility: Yes Wheelchair Assistance: 3: Mod assist Wheelchair Assistance Details: Tactile cues for initiation;Tactile cues for sequencing;Tactile cues for weight beaing;Verbal cues for sequencing;Verbal cues for technique;Visual cues/gestures for sequencing Wheelchair Propulsion: Left lower extremity;Left upper extremity Wheelchair Parts Management: Needs assistance Distance: 150 with assistance for use of LUE and LLE and to minimize rocking with trunk and for sequence and WB through LLE.  Other Treatments Treatments Therapeutic Activity: Sit to stand training from w/c with bilat UE support on her RLE for increased input and WB for activation of extensors for sit to stand and eccentric control stand to sit; performed stand > squat with UE on RLE and maintain squat position while advancing LLE forwards and backwards for increased weight shift, WB and knee control on RLE. Neuromuscular Facilitation: Right;Lower Extremity;Activity to increase motor control;Activity to increase timing and sequencing;Activity to increase sustained activation;Activity to increase lateral weight shifting;Activity to increase anterior-posterior weight  shifting in tall kneeling beginning with bilat UE support during tall kneeling squats in midline and  then to L and R; trunk and R hip stabilization control while maintaining tall kneeling without UE support and bilat UE AA flexion with manual facilitation on R side for trunk and R hip stabilization muscle activation while minimizing over activation of L sided trunk, neck and LE.    Therapy/Group: Individual Therapy  [name] [facility] 04/11/2011, 5:21 PM

## 2011-04-11 NOTE — Progress Notes (Signed)
Occupational Therapy Note [employer]  Patient Details  Name: Edward Choi MRN: [id number] Date of Birth: 1950-08-26 Today's Date: 04/11/2011 1130-1145 Pain:  No report of pain Skilled clinical intervention:  Patient participated in [employer] today to address safety with swallowing with upgraded diet.  Patient unable to effectively utilize right upper extremity to assist with feeding, although did demonstrate trace finger flexion and elbow extension following facilitation.  Patient attentive to right upper extremity. Group treatment  Edward Choi 04/11/2011, 3:46 PM

## 2011-04-11 NOTE — Plan of Care (Signed)
Problem: RH Simple Meal Prep Goal: LTG Patient will perform simple meal prep w/assist (OT) Outcome: Not Progressing Not yet attempted.     

## 2011-04-11 NOTE — Progress Notes (Signed)
Occupational Therapy Session Note  Patient Details  Name: Edward Choi MRN: [id number] Date of Birth: 05/18/1950  Today's Date: 04/11/2011 Time: 1000-1100 Time Calculation (min): 60 min  Precautions:  Precautions: Fall Precaution Comments: Regular diet , thin liquieds, no straw Required Braces or Orthoses: No Weight Bearing Restrictions: No  Short Term Goals: OT Short Term Goal 1: Pt will toilet with steady assist. OT Short Term Goal 2: Pt will don t shirt with verbal cues and set up. OT Short Term Goal 3: Pt will don pants with min assist. OT Short Term Goal 4: Pt will demonstrate improved dynamic sitting balance by bathing on tub seat with supervision. OT Short Term Goal 5: Pt will demonstrate self ROM exercises for RUE with minimal cues.  Skilled Therapeutic Interventions/Progress Updates:  Self care retraining to include shower, dress and groom.  Focus session on safe shower  ad W/C transfers, issued bath mitt to encourage increased attempts to utilize dominant RUE during bath, today required min assist to to wash top of right thigh with RUE, hemi dressing techniques, and increased use of RUE to stabilize items while stand at sink to brush teeth.  Patient able to self correct ~50% of the time in standing at sink to weight shift onto RLE and activate right side.  Noted inferior GH subluxation with ~1 finger width.  Pain: Pain Assessment Pain Score: 0-No pain  Therapy/Group: Individual Therapy  Lansing Sigmon 04/11/2011, 11:17 AM

## 2011-04-11 NOTE — Progress Notes (Signed)
Patient ID: Edward Choi, male   DOB: 27-Sep-1950, 61 y.o.   MRN: [id number]  Subjective/Complaints: Having bowel movement everyday per pt. Review of Systems  Respiratory: Negative.   Cardiovascular: Negative.   Gastrointestinal: Negative for constipation.  Neurological: Positive for focal weakness.   Objective: Vital Signs: Blood pressure 141/93, pulse 92, temperature 98 F (36.7 C), temperature source Oral, resp. rate 16, SpO2 93.00%. No results found. Results for orders placed during the hospital encounter of 04/06/11 (from the past 72 hour(s))  MRSA PCR SCREENING     Status: Normal   Collection Time   04/06/11  5:11 PM      Component Value Range Comment   MRSA by PCR NEGATIVE  NEGATIVE    CBC     Status: Abnormal   Collection Time   04/07/11  5:45 AM      Component Value Range Comment   WBC 6.7  4.0 - 10.5 (K/uL)    RBC 6.77 (*) 4.22 - 5.81 (MIL/uL)    Hemoglobin 13.4  13.0 - 17.0 (g/dL)    HCT 65.7  84.6 - 96.2 (%)    MCV 58.8 (*) 78.0 - 100.0 (fL)    MCH 19.8 (*) 26.0 - 34.0 (pg)    MCHC 33.7  30.0 - 36.0 (g/dL)    RDW 95.2 (*) 84.1 - 15.5 (%)    Platelets 232  150 - 400 (K/uL)   COMPREHENSIVE METABOLIC PANEL     Status: Abnormal   Collection Time   04/07/11  5:45 AM      Component Value Range Comment   Sodium 139  135 - 145 (mEq/L)    Potassium 4.2  3.5 - 5.1 (mEq/L)    Chloride 105  96 - 112 (mEq/L)    CO2 23  19 - 32 (mEq/L)    Glucose, Bld 108 (*) 70 - 99 (mg/dL)    BUN 12  6 - 23 (mg/dL)    Creatinine, Ser 3.24  0.50 - 1.35 (mg/dL)    Calcium 8.8  8.4 - 10.5 (mg/dL)    Total Protein 7.4  6.0 - 8.3 (g/dL)    Albumin 3.3 (*) 3.5 - 5.2 (g/dL)    AST 33  0 - 37 (U/L)    ALT 30  0 - 53 (U/L)    Alkaline Phosphatase 81  39 - 117 (U/L)    Total Bilirubin 0.5  0.3 - 1.2 (mg/dL)    GFR calc non Af Amer 89 (*) >90 (mL/min)    GFR calc Af Amer >90  >90 (mL/min)   DIFFERENTIAL     Status: Normal   Collection Time   04/07/11  5:45 AM      Component Value Range  Comment   Neutrophils Relative 58  43 - 77 (%)    Lymphocytes Relative 29  12 - 46 (%)    Monocytes Relative 9  3 - 12 (%)    Eosinophils Relative 3  0 - 5 (%)    Basophils Relative 1  0 - 1 (%)    Neutro Abs 3.9  1.7 - 7.7 (K/uL)    Lymphs Abs 1.9  0.7 - 4.0 (K/uL)    Monocytes Absolute 0.6  0.1 - 1.0 (K/uL)    Eosinophils Absolute 0.2  0.0 - 0.7 (K/uL)    Basophils Absolute 0.1  0.0 - 0.1 (K/uL)    RBC Morphology TARGET CELLS      HENT:  Head: Normocephalic.  Neck: Normal range of motion. No thyromegaly  present.  Cardiovascular: Normal rate and regular rhythm.  Pulmonary/Chest: Breath sounds normal. He has no wheezes.  Abdominal: He exhibits no distension. There is no tenderness.  Musculoskeletal: He exhibits no edema.  Neurological: He is alert.  Follows basic two-step motor commands. Answers simple questions. There is a language barrier but patient can communicate his needs. Patient with 1/5 strength intermittently throughout the right upper extremity. He is perhaps 1-1+ out of 5 right lower extremity . Is a right central 7 and tongue deviation. Speech is slightly less dysarthric. Sensation is right arm and leg Mildly diminished. Reflexes are 1+ throughout.  Skin: Skin is warm and dry.  Psychiatric: He has a normal mood and affect  CN central 7 on   Assessment/Plan: 1. Functional deficits secondary to L pontine infarct which require 3+ hours per day of interdisciplinary therapy in a comprehensive inpatient rehab setting. Physiatrist is providing close team supervision and 24 hour management of active medical problems listed below. Physiatrist and rehab team continue to assess barriers to discharge/monitor patient progress toward functional and medical goals. FIM: FIM - Bathing Bathing Steps Patient Completed: Chest;Right Arm;Abdomen;Front perineal area;Buttocks;Right upper leg;Left upper leg;Right lower leg (including foot) Bathing: 4: Min-Patient completes 8-9 38f 10 parts or 75+  percent  FIM - Upper Body Dressing/Undressing Upper body dressing/undressing: 0: Wears gown/pajamas-no public clothing FIM - Lower Body Dressing/Undressing Lower body dressing/undressing: 0: Wears gown/pajamas-no public clothing  FIM - Toileting Toileting steps completed by patient: Adjust clothing prior to toileting Toileting Assistive Devices: Grab bar or rail for support Toileting: 0: Activity did not occur  FIM - Diplomatic Services operational officer Devices: Grab bars Toilet Transfers: 4-To toilet/BSC: Min A (steadying Pt. > 75%)  FIM - Banker Devices:  (HHA) Bed/Chair Transfer: 1: Supine > Sit: Total A (helper does all/Pt. < 25%);3: Sit > Supine: Mod A (lifting assist/Pt. 50-74%/lift 2 legs);1: Bed > Chair or W/C: Total A (helper does all/Pt. < 25%);1: Chair or W/C > Bed: Total A (helper does all/Pt. < 25%)  FIM - Locomotion: Wheelchair Distance: 150 feet; educated on use of LUE for w/c parts management.  Performed w/c mobility on level surface with use of LLE for propulsion with manual facilitation for initiation and sequence. Locomotion: Wheelchair: 1: Travels less than 50 ft with maximal assistance (Pt: 25 - 49%) FIM - Locomotion: Ambulation Locomotion: Ambulation Assistive Devices: Cane - Quad;Other (comment) (L HHA on evaluation) Ambulation/Gait Assistance: 1: +1 Total assist Locomotion: Ambulation: 1: Travels 50 - 149 ft with total assistance/helper does all (Pt.< 25%)  Comprehension Comprehension Mode: Auditory Comprehension: 5-Understands complex 90% of the time/Cues < 10% of the time  Expression Expression Mode: Verbal Expression: 5-Expresses complex 90% of the time/cues < 10% of the time  Social Interaction Social Interaction: 5-Interacts appropriately 90% of the time - Needs monitoring or encouragement for participation or interaction.  Problem Solving Problem Solving Mode: Not assessed Problem Solving: 5-Solves  complex 90% of the time/cues < 10% of the time  Memory Memory: 5-Recognizes or recalls 90% of the time/requires cueing < 10% of the time  2. Anticoagulation/DVT prophylaxis with Pharmaceutical: Lovenox 3. Pain Management: Tylenol  Medical Problem List and Plan:  1. left paracentral pontine infarction. Continue aspirin therapy  2. DVT Prophylaxis/Anticoagulation: Subcutaneous Lovenox. Following platelets. 3. Dysphagia. Regular diet/ thin liquids. Tolerating well.  4. Hyperlipidemia. Crestor  5. Hypertension. Currently on no antihypertensive medications. Will monitor the increased activity  6. Coronary artery disease. CABG 06. Continue aspirin  7. Medical noncompliance. Provide counseling   LOS (Days) 2 A FACE TO FACE EVALUATION WAS PERFORMED

## 2011-04-11 NOTE — Progress Notes (Signed)
Up in chair most of shift. Calls appropriately. Remains continent of bowel and bladder. Min A stand/pivot transfer. No c/o pain. Small rash on back. Nurse staff to monitor.

## 2011-04-11 NOTE — Progress Notes (Signed)
Speech Pathology: Dysphagia Treatment Note  1145-1200 Group Session   Patient was observed with : Regular textures and Thin liquids.  Patient was noted to have s/s of aspiration : No  Lung Sounds:  WNL Temperature: WNL  Patient required: no cues to consistently follow precautions/strategies  Clinical Impression: Patient is modified independent after set up with regular textures and thin liquids.  All dysphagia goals met at this time.   Recommendations:  Discharge dysphagia goals  Pain:   none Intervention Required:   No  Goals: Goals Partially Met  Fae Pippin, M.A., CCC-SLP [phone]

## 2011-04-12 NOTE — Progress Notes (Signed)
Patient ID: Edward Choi, male   DOB: April 02, 1950, 61 y.o.   MRN: [id number] Limited English bur indicates no complaints Subjective/Complaints: Having bowel movement everyday per pt. Review of Systems  Respiratory: Negative.   Cardiovascular: Negative.   Gastrointestinal: Negative for constipation.  Neurological: Positive for focal weakness.   Objective: Vital Signs: Blood pressure 141/93, pulse 92, temperature 98 F (36.7 C), temperature source Oral, resp. rate 16, SpO2 93.00%. No results found. Results for orders placed during the hospital encounter of 04/06/11 (from the past 72 hour(s))  MRSA PCR SCREENING     Status: Normal   Collection Time   04/06/11  5:11 PM      Component Value Range Comment   MRSA by PCR NEGATIVE  NEGATIVE    CBC     Status: Abnormal   Collection Time   04/07/11  5:45 AM      Component Value Range Comment   WBC 6.7  4.0 - 10.5 (K/uL)    RBC 6.77 (*) 4.22 - 5.81 (MIL/uL)    Hemoglobin 13.4  13.0 - 17.0 (g/dL)    HCT 40.9  81.1 - 91.4 (%)    MCV 58.8 (*) 78.0 - 100.0 (fL)    MCH 19.8 (*) 26.0 - 34.0 (pg)    MCHC 33.7  30.0 - 36.0 (g/dL)    RDW 78.2 (*) 95.6 - 15.5 (%)    Platelets 232  150 - 400 (K/uL)   COMPREHENSIVE METABOLIC PANEL     Status: Abnormal   Collection Time   04/07/11  5:45 AM      Component Value Range Comment   Sodium 139  135 - 145 (mEq/L)    Potassium 4.2  3.5 - 5.1 (mEq/L)    Chloride 105  96 - 112 (mEq/L)    CO2 23  19 - 32 (mEq/L)    Glucose, Bld 108 (*) 70 - 99 (mg/dL)    BUN 12  6 - 23 (mg/dL)    Creatinine, Ser 2.13  0.50 - 1.35 (mg/dL)    Calcium 8.8  8.4 - 10.5 (mg/dL)    Total Protein 7.4  6.0 - 8.3 (g/dL)    Albumin 3.3 (*) 3.5 - 5.2 (g/dL)    AST 33  0 - 37 (U/L)    ALT 30  0 - 53 (U/L)    Alkaline Phosphatase 81  39 - 117 (U/L)    Total Bilirubin 0.5  0.3 - 1.2 (mg/dL)    GFR calc non Af Amer 89 (*) >90 (mL/min)    GFR calc Af Amer >90  >90 (mL/min)   DIFFERENTIAL     Status: Normal   Collection Time   04/07/11  5:45 AM      Component Value Range Comment   Neutrophils Relative 58  43 - 77 (%)    Lymphocytes Relative 29  12 - 46 (%)    Monocytes Relative 9  3 - 12 (%)    Eosinophils Relative 3  0 - 5 (%)    Basophils Relative 1  0 - 1 (%)    Neutro Abs 3.9  1.7 - 7.7 (K/uL)    Lymphs Abs 1.9  0.7 - 4.0 (K/uL)    Monocytes Absolute 0.6  0.1 - 1.0 (K/uL)    Eosinophils Absolute 0.2  0.0 - 0.7 (K/uL)    Basophils Absolute 0.1  0.0 - 0.1 (K/uL)    RBC Morphology TARGET CELLS      HENT:  Head: Normocephalic.  Neck: Normal range  of motion. No thyromegaly present.  Cardiovascular: Normal rate and regular rhythm.  Pulmonary/Chest: Breath sounds normal. He has no wheezes.  Abdominal: He exhibits no distension. There is no tenderness.  Musculoskeletal: He exhibits no edema.  Neurological: He is alert.  Follows basic two-step motor commands. Answers simple questions. There is a language barrier but patient can communicate his needs. Patient with 1/5 strength intermittently throughout the right upper extremity. He is perhaps 1-1+ out of 5 right lower extremity . Is a right central 7 and tongue deviation. Speech is slightly less dysarthric. Sensation is right arm and leg Mildly diminished. Reflexes are 1+ throughout.  Skin: Skin is warm and dry.  Psychiatric: He has a normal mood and affect  CN central 7 on   Assessment/Plan: 1. Functional deficits secondary to L pontine infarct which require 3+ hours per day of interdisciplinary therapy in a comprehensive inpatient rehab setting. Physiatrist is providing close team supervision and 24 hour management of active medical problems listed below. Physiatrist and rehab team continue to assess barriers to discharge/monitor patient progress toward functional and medical goals. FIM: FIM - Bathing Bathing Steps Patient Completed: Chest;Right Arm;Abdomen;Front perineal area;Buttocks;Right upper leg;Left upper leg;Right lower leg (including foot) Bathing: 4:  Min-Patient completes 8-9 62f 10 parts or 75+ percent  FIM - Upper Body Dressing/Undressing Upper body dressing/undressing: 0: Wears gown/pajamas-no public clothing FIM - Lower Body Dressing/Undressing Lower body dressing/undressing: 0: Wears gown/pajamas-no public clothing  FIM - Toileting Toileting steps completed by patient: Adjust clothing prior to toileting Toileting Assistive Devices: Grab bar or rail for support Toileting: 0: Activity did not occur  FIM - Diplomatic Services operational officer Devices: Grab bars Toilet Transfers: 4-To toilet/BSC: Min A (steadying Pt. > 75%)  FIM - Banker Devices:  (HHA) Bed/Chair Transfer: 1: Supine > Sit: Total A (helper does all/Pt. < 25%);3: Sit > Supine: Mod A (lifting assist/Pt. 50-74%/lift 2 legs);1: Bed > Chair or W/C: Total A (helper does all/Pt. < 25%);1: Chair or W/C > Bed: Total A (helper does all/Pt. < 25%)  FIM - Locomotion: Wheelchair Distance: 150 feet; educated on use of LUE for w/c parts management.  Performed w/c mobility on level surface with use of LLE for propulsion with manual facilitation for initiation and sequence. Locomotion: Wheelchair: 1: Travels less than 50 ft with maximal assistance (Pt: 25 - 49%) FIM - Locomotion: Ambulation Locomotion: Ambulation Assistive Devices: Cane - Quad;Other (comment) (L HHA on evaluation) Ambulation/Gait Assistance: 1: +1 Total assist Locomotion: Ambulation: 1: Travels 50 - 149 ft with total assistance/helper does all (Pt.< 25%)  Comprehension Comprehension Mode: Auditory Comprehension: 5-Understands complex 90% of the time/Cues < 10% of the time  Expression Expression Mode: Verbal Expression: 5-Expresses complex 90% of the time/cues < 10% of the time  Social Interaction Social Interaction: 5-Interacts appropriately 90% of the time - Needs monitoring or encouragement for participation or interaction.  Problem Solving Problem Solving  Mode: Not assessed Problem Solving: 5-Solves complex 90% of the time/cues < 10% of the time  Memory Memory: 5-Recognizes or recalls 90% of the time/requires cueing < 10% of the time  2. Anticoagulation/DVT prophylaxis with Pharmaceutical: Lovenox 3. Pain Management: Tylenol  Medical Problem List and Plan:  1. left paracentral pontine infarction. Continue aspirin therapy  2. DVT Prophylaxis/Anticoagulation: Subcutaneous Lovenox. Following platelets. 3. Dysphagia. Regular diet/ thin liquids. Tolerating well.  4. Hyperlipidemia. Crestor  5. Hypertension. Currently on no antihypertensive medications. Will monitor the increased activity  6. Coronary artery disease.  CABG 06. Continue aspirin  7. Medical noncompliance. Provide counseling   LOS (Days) 2 A FACE TO FACE EVALUATION WAS PERFORMED

## 2011-04-12 NOTE — Patient Care Conference (Signed)
Inpatient RehabilitationTeam Conference Note Date: 04/12/2011   Time: 11:30 AM   Patient Name: Edward Choi      Medical Record Number: [id number]  Date of Birth: November 23, 1950 Sex: Male         Room/Bed: 4140/4140-01 Payor Info: Payor: BLUE CROSS BLUE SHIELD  Plan: BCBS PPO OUT OF STATE  Product Type: *No Product type*     Admitting Diagnosis: LT CVA  Admit Date/Time:  04/06/2011  3:58 PM Admission Comments: No comment available   Primary Diagnosis:  Stroke Principal Problem: Stroke  Patient Active Problem List  Diagnoses Date Noted  . Stroke 04/07/2011  . CAD (coronary artery disease) 04/03/2011  . CVA (cerebrovascular accident) 04/03/2011  . Hypertension 04/03/2011  . Depression 04/03/2011  . Hx of CABG 04/03/2011  . Hyperlipidemia 04/03/2011    Expected Discharge Date: Expected Discharge Date: 04/27/11  Team Members Present: Physician: Dr. Nat Math, RN Case Manager Present: Lutricia Horsfall, RN Social Worker Present: Dossie Der, LCSW PT Present: [name], PT OT Present: Leonette Monarch, Felipa Eth, OT SLP Present: Fae Pippin, SLP     Current Status/Progress Goal Weekly Team Focus  Medical   HTN controlled  maintain good control  maintain med stability   Bowel/Bladder   Continent of bladder, incontinent of stool x1  Continent of bowel and bladder  Monitor   Swallow/Nutrition/ Hydration             ADL's   Overall Moderate-Minimal assistance with BADL, bathroom transfers  Overall Modified Independent,  toilet transfers & simple snack prep, Supervision shower transfer  ADL retraining, RUE neuromuscular re-ed, balance activities, pt/family education   Mobility   min-mod A bed mobility and transfers, max-total A for gait and stairs  mod I overall except supervision car transfers and stairs  transfer training, activation and motor control RLE, gait with [facility]   Communication             Safety/Cognition/ Behavioral  Observations            Pain   No c/o pain  <3  Monitor   Skin   Rash to upper part of back. Applying microguard powder  CDI  Assess skin for decrease in rash    Rehab Goals Patient on target to meet rehab goals: Yes *See Interdisciplinary Assessment and Plan and progress notes for long and short-term goals  Barriers to Discharge: poor neuro recovery    Possible Resolutions to Barriers:  functional compensation    Discharge Planning/Teaching Needs:  Home with wife who works days but Mother in-law is there but can only do supervision level.  wife has had FMLA papers completed.      Team Discussion:  Discussion of pt's dx, hx, goals and d/c plan. Pt requires A with uninal. Incont of urine x 1. Needs AFO.  D/C paln.  Revisions to Treatment Plan:  SLP d/c'd pt.   Continued Need for Acute Rehabilitation Level of Care: The patient requires daily medical management by a physician with specialized training in physical medicine and rehabilitation for the following conditions: Daily direction of a multidisciplinary physical rehabilitation program to ensure safe treatment while eliciting the highest outcome that is of practical value to the patient.: Yes Daily medical management of patient stability for increased activity during participation in an intensive rehabilitation regime.: Yes Daily analysis of laboratory values and/or radiology reports with any subsequent need for medication adjustment of medical intervention for : Neurological problems  Meryl Dare 04/12/2011, 5:03 PM

## 2011-04-12 NOTE — Progress Notes (Signed)
Occupational Therapy Session Note  Patient Details  Name: Edward Choi MRN: [id number] Date of Birth: October 09, 1950  Today's Date: 04/12/2011 Time: 0900-1000 Time Calculation (min): 60 min  Precautions: Precautions Precautions: Fall Precaution Comments: Regular diet , thin liquieds, no straw Required Braces or Orthoses: No Restrictions Weight Bearing Restrictions: No  Short Term Goals: OT Short Term Goal 1: Pt will toilet with steady assist. OT Short Term Goal 2: Pt will don t shirt with verbal cues and set up. OT Short Term Goal 3: Pt will don pants with min assist. OT Short Term Goal 4: Pt will demonstrate improved dynamic sitting balance by bathing on tub seat with supervision. OT Short Term Goal 5: Pt will demonstrate self ROM exercises for RUE with minimal cues.  Skilled Therapeutic Interventions/Progress Updates:  No c/o pain.  Patient sitting in W/C with quick release belt in place and blanket in his lap because reports had bowel accident.  Patient reports he was unable to control the loose "poo".  Self care retraining to include shower, UB dressing (no clean underware or pants.  Called wife - she will bring in shoes and clean clothes.  Focus session on increased use of and weightbearing through RUE & RLE during  w/c<>tub bench transfers, sit<>stand, stand balance, and BADL tasks.  Therapy/Group: Individual Therapy  Jeriko Kowalke 04/12/2011, 11:41 AM

## 2011-04-12 NOTE — Progress Notes (Addendum)
Occupational Therapy Session Note  Patient Details  Name: Edward Choi MRN: [id number] Date of Birth: May 09, 1950  Today's Date: 04/12/2011 Time: 1005-1100 Time Calculation (min): 55 min  Precautions: Precautions Precautions: Fall Precaution Comments: Regular diet , thin liquieds, no straw Required Braces or Orthoses: No Restrictions Weight Bearing Restrictions: No  Short Term Goals: OT Short Term Goal 1: Pt will toilet with steady assist. OT Short Term Goal 2: Pt will don t shirt with verbal cues and set up. OT Short Term Goal 3: Pt will don pants with min assist. OT Short Term Goal 4: Pt will demonstrate improved dynamic sitting balance by bathing on tub seat with supervision. OT Short Term Goal 5: Pt will demonstrate self ROM exercises for RUE with minimal cues.  Skilled Therapeutic Interventions:  RUE and RLE neuromuscular re-education with focus on encouraging weight shifts and weight bearing through RLE with sit<>stand, stand and squat during functional tasks while maintaining balance.  Also focus on RUE weight bearing through hand with elbow supported while dynamically reaching to right and/or forward with LUE.  Pain Pain Assessment Pain Assessment: No/denies pain Pain Score: 0-No pain  Therapy/Group: Co-Treatment with TR  Mialani Reicks 04/12/2011, 4:15 PM

## 2011-04-12 NOTE — Progress Notes (Signed)
Physical Therapy Session Note  Patient Details  Name: Hargis Vandyne MRN: [id number] Date of Birth: 02/03/1951  Today's Date: 04/12/2011 Time: 1305-1400 Time Calculation (min): 55 min  Precautions: Precautions Precautions: Fall Precaution Comments: Regular diet , thin liquieds, no straw Required Braces or Orthoses: No Restrictions Weight Bearing Restrictions: No  Short Term Goals: PT Short Term Goal 1: Patient will perform bed mobility flat bed to L and R with consistent min A with minimal verbal and tactile cues. PT Short Term Goal 2: Patient will perform bed  <> chair transfers to L and R with consistent min A with intermittent verbal and tactile cues. PT Short Term Goal 3: Patient will perform w/c mobility on unit with L hemi technique with min A PT Short Term Goal 4: Patient will perform gait training x 100' with LRAD and mod A. PT Short Term Goal 5: Patient will perform up and down 3 stairs with one rail and mod A for home entry/exit.  Pain Pain Assessment Pain Assessment: No/denies pain Pain Score: 0-No pain Locomotion  Ambulation Ambulation: Yes Ambulation/Gait Assistance: 3: Mod assist Ambulation Distance (Feet): 40 Feet Assistive device: Large base quad cane Ambulation/Gait Assistance Details: Tactile cues for sequencing;Tactile cues for placement;Tactile cues for posture;Visual cues for safe use of DME/AE;Verbal cues for sequencing;Verbal cues for precautions/safety;Verbal cues for gait pattern;Verbal cues for safe use of DME/AE Ambulation/Gait Assistance Details (indicate cue type and reason): Level surface gait training following NMR; required less assistance for RLE advancement, heel strike and placement, and activation of RLE extensors and advancement over R stance LE.   Wheelchair Mobility Distance: 150 min-mod A for sequencing with LLE and UE.  Other Treatments Treatments Neuromuscular Facilitation: Right;Lower Extremity;Activity to increase timing and  sequencing;Activity to increase sustained activation;Activity to increase lateral weight shifting;Activity to increase anterior-posterior weight shifting during tall kneeling beginning with bilat UE support during tall kneel squats in midline and to the R and static tall kneeling without UE support and in half kneeling with LLE forward with manual facilitation for extensor activation and core activation.  Extensors activation training on Kinetron in sitting and standing during bilat LE extension with focus on isolated activation while minimizing trunk lateral lean and trunk extension; in standing on Kinetron performed postural control training to bring COG into midline and maintain with lateral and anterior weight shifting with tactile cues.  Therapy/Group: Individual Therapy  [name] [facility] 04/12/2011, 4:51 PM

## 2011-04-12 NOTE — Progress Notes (Signed)
Progress Notes  Speech Language Pathology Therapy Note & Discharge Summary  Patient Details  Name: Edward Choi MRN: [id number] Date of Birth: August 30, 1950  Today's Date: 04/12/2011 Time: 1500-1530 Time Calculation (min): 30 min  Precautions: Precautions Precautions: Fall Precaution Comments: Regular diet , thin liquieds, no straw Required Braces or Orthoses: No Restrictions Weight Bearing Restrictions: No  Skilled Therapeutic Interventions/Progress Updates: Session focused on carryover of oral motor exercises and use of speech compensatory strategies during a conversational exchange.  Patient reports knowing exercises but not wanting to do them.  Wife present for session and patient was able to verbally express concerns with medication and assistance with meal set up.  His expression (in [place]) was characterized by mostly phrase-sentence level expression with self correction x1 to clarify what he said so that SLP could understand.  Patient demonstrates overall modified independent self monitoring and correction with expression.  During verbal expression with his wife (in [place]) patient appeared to verbally express himself at conversational level and his wife reports understanding him.  Patient appears to be at baseline function and no further SLP services are warranted at this time.    Pain Pain Assessment Pain Assessment: No/denies pain Pain Score: 0-No pain  Therapy/Group: Individual Therapy  Speech Language Pathology Discharge Summary   Long term goals set: 2  Long term goals met: 2  Comments on progress toward goals: Patient has progressed from a Dys.3 textures and thin liquids via cup diet to regular textures with thin liquids via straw.  Patient demonstrates no overt s/s of aspiration and has tolerated this diet well for 3 days now.  Additionally, patient is modified independent with: completing his oral motor exercises, should he choose; verbally expressing his wants and  needs.  Both patient and wife agree he is at baseline function and no follow up is required at this time.   Reasons goals not met: n/a  Equipment acquired: none  Reasons for discharge: treatment goals met  Follow-up: none  Patient/family agrees with progress made and goals achieved: Yes  Fae Pippin, M.A., CCC-SLP [phone]  [name] 04/12/2011

## 2011-04-12 NOTE — Progress Notes (Signed)
Patient ID: Edward Choi, male   DOB: 1951-03-04, 61 y.o.   MRN: [id number] Met with pt to report on team conference. Pt in agreement with goals and d/c date of 04/27/11. Called pt's wife and left message re: d/c date. Plan to f/u with wife.

## 2011-04-13 DIAGNOSIS — I6330 Cerebral infarction due to thrombosis of unspecified cerebral artery: Secondary | ICD-10-CM

## 2011-04-13 DIAGNOSIS — Z5189 Encounter for other specified aftercare: Secondary | ICD-10-CM

## 2011-04-13 DIAGNOSIS — G8100 Flaccid hemiplegia affecting unspecified side: Secondary | ICD-10-CM

## 2011-04-13 NOTE — Progress Notes (Addendum)
Physical Therapy Session Note  Patient Details  Name: Edward Choi MRN: [id number] Date of Birth: [birth year]  Today's Date: 04/13/2011 Time: 2952-8413 Time Calculation (min): 31 min  Precautions: Precautions Precautions: Fall Precaution Comments: requal diet with thin liquids, no straw, right hemiplegia Required Braces or Orthoses: No Restrictions Weight Bearing Restrictions: No  Short Term Goals: PT Short Term Goal 1: Patient will perform bed mobility flat bed to L and R with consistent min A with minimal verbal and tactile cues. PT Short Term Goal 1 - Progress: Progressing toward goal PT Short Term Goal 2: Patient will perform bed  <> chair transfers to L and R with consistent min A with intermittent verbal and tactile cues. PT Short Term Goal 2 - Progress: Met PT Short Term Goal 3: Patient will perform w/c mobility on unit with L hemi technique with min A PT Short Term Goal 3 - Progress: Met PT Short Term Goal 4: Patient will perform gait training x 100' with LRAD and mod A. PT Short Term Goal 4 - Progress: Met PT Short Term Goal 5: Patient will perform up and down 3 stairs with one rail and mod A for home entry/exit. PT Short Term Goal 5 - Progress: Met  Skilled Therapeutic Interventions/Progress Updates:     General Family/Caregiver Present: No  Pain Pain Assessment Pain Assessment: No/denies pain Pain Score: 0-No pain  Other Treatments  Gait assessment with right hand held assist and right blue rocker AFO per earlier PT session mod assist, increased left lateral weight shift with left lateral trunk lean in left stance with right hip circumduction compensation noted in swing phase and right knee hyperextension and hip ER noted in stance phase with right trunk collapsing laterally. Gait with no AD, right blue rocker AFO, PT behind patient mod assist with manual facilitation at rib cage to increase right trunk elongation, decrease left lateral trunk lean, and increase  right hip extension in right stance phase with good result, however patient stated he felt "unsafe" when not holding on to anything. Added RW with right hand splint to increase right UE weight bearing and patient's sense of security. Gait with RW, right AFO x 60 feet min assist with manual facilitation at right hip to increase hip extension in stance phase and decrease right hip circumduction in swing phase to promote symmetry and more typical gait pattern. More graded lateral weight shifts with decreased left lateral trunk lean in left stance phase noted with more symmetrical step through pattern and no right knee hyperextension noted during last gait trial.  Stair negotiation leading up with right LE and down with left LE to increase sustained right stance components and eccentric control with left rail min assist for home entry.  Therapy/Group: Individual Therapy  [name] 04/13/2011, 5:26 PM

## 2011-04-13 NOTE — Progress Notes (Signed)
Occupational Therapy Note  Patient Details  Name: Edward Choi MRN: [id number] Date of Birth: 1950-07-27 Today's Date: 04/13/2011  Time In:  1120  Time Out:  12:04  Individual session.  Patient denies pain.  Neuro re-education with emphasis on increasing active movement in RUE.  With facilitation, patient able to actively engage in active assist in shoulder flexion, shoulder extension, shoulder ab and adduction, elbow flexion and extension in supine and seated position.  Patient also able to use RUE as stabilizer with min assist.  Patient requires mod assist with SROM exercises secondary to significant apraxia.  All joints are currently pain free and with full passive range of motion.  Treatment also focused on sit to stand, stand to sit, and functional ambulation with RUE in weight bearing and right side of body actively engaged in muscular activity.  Patient extremely motivated.     Norton Pastel 04/13/2011, 2:17 PM

## 2011-04-13 NOTE — Progress Notes (Signed)
Physical Therapy Note  Patient Details  Name: Edward Choi MRN: [id number] Date of Birth: October 15, 1950 Today's Date: 04/13/2011  2841-3244 (45 minutes) individual treatment session Pain- no complaint of pain Focus of treatment: neuro reed Rt LE; gait training with/without AD to improve swing phase of gait on right Treatment: Transfer - min assist SPT LBQC; sit to supine  (mat) SBA with difficulty Lt LE; Sidelying (gravity eliminated) RT hip flexion AA 3 X 10; supine AA hip abduction X 10, hip ER/IR in hooklying X 10; bridging X 10 ;  Gait training: 50 feet LBQC with excessive trunk lean to left with circumduction RT LE during swing (with RT AFO) ; gait training along rail with tactile cues to decrease trunk lean to left and posterior trunk extension  during swing on right Edward Choi,JIM 04/13/2011, 10:13 AM

## 2011-04-13 NOTE — Progress Notes (Signed)
Patient ID: Edward Choi, male   DOB: 03-Mar-1951, 61 y.o.   MRN: [id number] Limited English but indicates no complaints. Able to move R UE more Subjective/Complaints: Having bowel movement everyday per pt. Review of Systems  Respiratory: Negative.   Cardiovascular: Negative.   Gastrointestinal: Negative for constipation.  Neurological: Positive for focal weakness.   Objective: Vital Signs: Blood pressure 114/78, pulse 99, temperature 98 F (36.7 C), temperature source Oral, resp. rate 16, SpO2 93.00%. No results found. Results for orders placed during the hospital encounter of 04/06/11 (from the past 72 hour(s))  MRSA PCR SCREENING     Status: Normal   Collection Time   04/06/11  5:11 PM      Component Value Range Comment   MRSA by PCR NEGATIVE  NEGATIVE    CBC     Status: Abnormal   Collection Time   04/07/11  5:45 AM      Component Value Range Comment   WBC 6.7  4.0 - 10.5 (K/uL)    RBC 6.77 (*) 4.22 - 5.81 (MIL/uL)    Hemoglobin 13.4  13.0 - 17.0 (g/dL)    HCT 14.7  82.9 - 56.2 (%)    MCV 58.8 (*) 78.0 - 100.0 (fL)    MCH 19.8 (*) 26.0 - 34.0 (pg)    MCHC 33.7  30.0 - 36.0 (g/dL)    RDW 13.0 (*) 86.5 - 15.5 (%)    Platelets 232  150 - 400 (K/uL)   COMPREHENSIVE METABOLIC PANEL     Status: Abnormal   Collection Time   04/07/11  5:45 AM      Component Value Range Comment   Sodium 139  135 - 145 (mEq/L)    Potassium 4.2  3.5 - 5.1 (mEq/L)    Chloride 105  96 - 112 (mEq/L)    CO2 23  19 - 32 (mEq/L)    Glucose, Bld 108 (*) 70 - 99 (mg/dL)    BUN 12  6 - 23 (mg/dL)    Creatinine, Ser 7.84  0.50 - 1.35 (mg/dL)    Calcium 8.8  8.4 - 10.5 (mg/dL)    Total Protein 7.4  6.0 - 8.3 (g/dL)    Albumin 3.3 (*) 3.5 - 5.2 (g/dL)    AST 33  0 - 37 (U/L)    ALT 30  0 - 53 (U/L)    Alkaline Phosphatase 81  39 - 117 (U/L)    Total Bilirubin 0.5  0.3 - 1.2 (mg/dL)    GFR calc non Af Amer 89 (*) >90 (mL/min)    GFR calc Af Amer >90  >90 (mL/min)   DIFFERENTIAL     Status: Normal   Collection Time   04/07/11  5:45 AM      Component Value Range Comment   Neutrophils Relative 58  43 - 77 (%)    Lymphocytes Relative 29  12 - 46 (%)    Monocytes Relative 9  3 - 12 (%)    Eosinophils Relative 3  0 - 5 (%)    Basophils Relative 1  0 - 1 (%)    Neutro Abs 3.9  1.7 - 7.7 (K/uL)    Lymphs Abs 1.9  0.7 - 4.0 (K/uL)    Monocytes Absolute 0.6  0.1 - 1.0 (K/uL)    Eosinophils Absolute 0.2  0.0 - 0.7 (K/uL)    Basophils Absolute 0.1  0.0 - 0.1 (K/uL)    RBC Morphology TARGET CELLS      HENT:  Head: Normocephalic.  Neck: Normal range of motion. No thyromegaly present.  Cardiovascular: Normal rate and regular rhythm.  Pulmonary/Chest: Breath sounds normal. He has no wheezes.  Abdominal: He exhibits no distension. There is no tenderness.  Musculoskeletal: He exhibits no edema.  Neurological: He is alert.  Follows basic two-step motor commands. Answers simple questions. There is a language barrier but patient can communicate his needs. Patient with 2-/5 strength intermittently  right biceps. He is perhaps 1-1+ out of 5 right lower extremity . Is a right central 7 and tongue deviation. Speech is slightly less dysarthric. Sensation is right arm and leg Mildly diminished. Reflexes are 1+ throughout.  Skin: Skin is warm and dry.  Psychiatric: He has a normal mood and affect  CN central 7 on R  Assessment/Plan: 1. Functional deficits secondary to L pontine infarct causing R hemiparesis which require 3+ hours per day of interdisciplinary therapy in a comprehensive inpatient rehab setting. Physiatrist is providing close team supervision and 24 hour management of active medical problems listed below. Physiatrist and rehab team continue to assess barriers to discharge/monitor patient progress toward functional and medical goals.   2. Anticoagulation/DVT prophylaxis with Pharmaceutical: Lovenox 3. Pain Management: Tylenol  Medical Problem List and Plan:  1. left paracentral pontine  infarction. Continue aspirin therapy  2. DVT Prophylaxis/Anticoagulation: Subcutaneous Lovenox. Following platelets. 3. Dysphagia. Regular diet/ thin liquids. Tolerating well.  4. Hyperlipidemia. Crestor  5. Hypertension. Currently on no antihypertensive medications. Will monitor the increased activity  6. Coronary artery disease. CABG 06. Continue aspirin  7. Medical noncompliance. Provide counseling   LOS (Days) 2 A FACE TO FACE EVALUATION WAS PERFORMED

## 2011-04-13 NOTE — Progress Notes (Signed)
Occupational Therapy Weekly Progress Note  Patient Details  Name: Edward Choi MRN: [id number] Date of Birth: 10-10-1950  Today's Date: 04/13/2011 Time: 0900-0945 Time Calculation (min): 45 min  OT Short Term Goals WEEK ONE OT Short Term Goal 1: Pt will toilet with steady assist. OT Short Term Goal 1 - Progress: Met OT Short Term Goal 2: Pt will don t shirt with verbal cues and set up. OT Short Term Goal 2 - Progress: Partly met (patient has only had clothes for 2 days) OT Short Term Goal 3: Pt will don pants with min assist. OT Short Term Goal 3 - Progress: Met OT Short Term Goal 4: Pt will demonstrate improved dynamic sitting balance by bathing on tub seat with supervision. OT Short Term Goal 4 - Progress: Met OT Short Term Goal 5: Pt will demonstrate self ROM exercises for RUE with minimal cues. OT Short Term Goal 5 - Progress: Met  WEEK TWO: OT Short Term Goal 1: Pt will bathe with supervision in shower in sit and stand OT Short Term Goal 2: Pt will dress UB & LB with supervision in sit and stand OT Short Term Goal 3: Pt will perform toilet transfers with supervision OT Short Term Goal 4: Pt will use RUE as a stabilizer during all ADL tasks with min cues OT Short Term Goal 5: Pt will demonstrate self ROM exercises for RUE with minimal cues.  Therapy Documentation Precautions: Precautions Precautions: Fall Precaution Comments: Regular diet , thin liquieds, no straw Required Braces or Orthoses: No Restrictions Weight Bearing Restrictions: No  Pain Pain Assessment Pain Assessment: No/denies pain  Skilled intervention/progress update: Self care retraining to include groom, shower and dressing.  Focus session on shaving while stand at sink to include clipping long mustache and beard before able to shave with electric razor (first time shave since in hospital).  Safe shower transfers, sit<> stand, weight bearing into RLE & RUE, use of RUE as stabilizer during task of don  shoes, hemi dressing techniques, RUE SROM exercises introduced.  Patient has met 4 of 5 short term goals this recording period.  Patient only had clothes here for two day therefore decreased time to practice dressing.  Patient continues to demonstrate the following deficits: dynamic balance in sit and stand, right hemiplegia, abnormal posture, and abnormal movement patterns therefore, will continue to benefit from skilled OT intervention to enhance overall performance with ADL and iADL.  Treatment Plan: Balance/vestibular training;DME/adaptive equipment instruction;Functional mobility training;Self Care/advanced ADL retraining;Patient/family education;Neuromuscular re-education;Therapeutic Activities;Therapeutic Exercise;UE/LE Strength taining/ROM;Visual/perceptual remediation/compensation   Patient progressing toward long term goals..  Continue plan of care.  ADL ADL Eating: Set up Where Assessed-Eating: Chair Grooming: Minimal assistance Where Assessed-Grooming: Standing at sink Upper Body Bathing: Minimal assistance Where Assessed-Upper Body Bathing: Shower Lower Body Bathing: Supervision/safety Where Assessed-Lower Body Bathing: Shower (sit and stand) Upper Body Dressing: Minimal assistance Where Assessed-Upper Body Dressing: Chair Lower Body Dressing: Minimal assistance Where Assessed-Lower Body Dressing: Chair (sit and stand) Toileting: Minimal assistance Where Assessed-Toileting: Teacher, adult education: Curator Method: Surveyor, minerals: Engineer, technical sales: Not assessed Film/video editor: Insurance underwriter Method: Warden/ranger: Transfer tub bench;Grab bars  Other Treatments Treatments Therapeutic Activity: Focus on normal movement patterns with all BADL & IADL retraining, increased used of RUE as stabilizer and during weight bearing activities.  Patient able to  lock W/C brakes with RUE one hand is placed on brake. Neuromuscular Facilitation: Right;Upper Extremity;Activity to  increase sustained activation Weight Bearing Technique Weight Bearing Technique: Yes RUE Weight Bearing Technique: Extended arm standing;Extended arm seated Response to Weight Bearing Technique: no complaints of pain during weight bearing, minimal UE activation noted  Therapy/Group: Individual Therapy  Deseree Zemaitis 04/13/2011, 12:18 PM

## 2011-04-13 NOTE — Progress Notes (Signed)
Physical Therapy Note  Patient Details  Name: Edward Choi MRN: [id number] Date of Birth: 07/17/1950 Today's Date: 04/13/2011  1400-1440 (40 minutes) individual treatment session Pain- no complaint of pain Focus of treatment: Neuro reed Rt LE using powderboard (gravity eliminated); gait training with Rt AFO (Blue Rocker) without AD Treatment: Rt hip flexion/extension using powderboard (gravity eliminated) to facilitate full range control ; Rt knee flexion/extension using powderboard- pt has difficulty isolating knee extension; Gait without AD using AFO- 15 feet X 4 with tactile cues to decrease hip ER during swing/stance (mod assist with decreased RT hip extension and intermittent knee hyperextension in stance. Pt able to advance Rt LE with less left trunk lean and circumduction during swing.  Damontre Millea,JIM 04/13/2011, 2:28 PM

## 2011-04-14 DIAGNOSIS — G8100 Flaccid hemiplegia affecting unspecified side: Secondary | ICD-10-CM

## 2011-04-14 DIAGNOSIS — Z5189 Encounter for other specified aftercare: Secondary | ICD-10-CM

## 2011-04-14 DIAGNOSIS — I6330 Cerebral infarction due to thrombosis of unspecified cerebral artery: Secondary | ICD-10-CM

## 2011-04-14 NOTE — Progress Notes (Signed)
Occupational Therapy Session Note  Patient Details  Name: Edward Choi MRN: [id number] Date of Birth: July 07, 1950  Today's Date: 04/14/2011 Time: 1006-1100 Time Calculation (min): 54 min  Precautions: Precautions Precautions: Fall Precaution Comments: requal diet with thin liquids, no straw, right hemiplegia Required Braces or Orthoses: No Restrictions Weight Bearing Restrictions: No  Short Term Goals: OT Short Term Goal 1: Patient will bathe with supervision in shower in sit and stand OT Short Term Goal 1 - Progress: Met OT Short Term Goal 2: Patient will dress UB & LB with supervision in sit and stand OT Short Term Goal 2 - Progress: Partly met (patient has only had clothes for 2 days) OT Short Term Goal 3: Patient will perform toilet transfers and toileting with supervision OT Short Term Goal 3 - Progress: Met OT Short Term Goal 4: Patient will use RUE as a stabilizer during all ADL tasks with min cues OT Short Term Goal 4 - Progress: Met OT Short Term Goal 5: Pt will demonstrate self ROM exercises for RUE with minimal cues. OT Short Term Goal 5 - Progress: Met  Skilled Therapeutic Interventions/Progress Updates:    Selfcare retraining at the shower level.  Pt continues to need mod instructional cueing to position RUE correctly prior to standing as well as taking it off of the counter or walker prior to sitting.  Needs total hand over hand assist to integrate into functional tasks as an active assist.  Did note pt attempted functional use of it as a stabilizer while standing at the sink to hold his toothbrush and toothpaste while removing the cap and applying.  Needs cues to slow down secondary to moving quickly.  Re-enforced hemi dressing techniques for donning socks.  Pt required assist to donn sock over the RLE while crossing it over the knee.  At conclusion of B/D session went to the gym and worked on isolated right shoulder flexion using a tilted stool.  Pt needs mod  demonstrational cues to keep from overcompensating with the right side and attempting to use his trunk in rotation to move the UE Pain Pt reports no pain.  ADL SEE FIM Section  Therapy/Group: Individual Therapy  Triniti Gruetzmacher OTR/L 04/14/2011, 12:51 PM

## 2011-04-14 NOTE — Progress Notes (Signed)
Social Work Patient ID: Edward Choi, male   DOB: May 18, 1950, 61 y.o.   MRN: [id number] Met with pt to inquire about wife coming for therapies. He reports she will be here this weekend. She is working so when he comes home she can be off with pt.  He is pleased with his progress. Wife applying for Medicaid in [facility] where they live.  Will see wife Monday when here To discuss discharge plans and recommendations.

## 2011-04-14 NOTE — Progress Notes (Signed)
Patient ID: Edward Choi, male   DOB: 02/13/1951, 61 y.o.   MRN: [id number] Limited English but indicates no complaints. Able to move R UE more.Encouraged with progress Subjective/Complaints: Having bowel movement everyday per pt. Review of Systems  Respiratory: Negative.   Cardiovascular: Negative.   Gastrointestinal: Negative for constipation.  Neurological: Positive for focal weakness.   Objective: Vital Signs: Blood pressure 114/78, pulse 99, temperature 98 F (36.7 C), temperature source Oral, resp. rate 16, SpO2 93.00%. No results found. Results for orders placed during the hospital encounter of 04/06/11 (from the past 72 hour(s))  MRSA PCR SCREENING     Status: Normal   Collection Time   04/06/11  5:11 PM      Component Value Range Comment   MRSA by PCR NEGATIVE  NEGATIVE    CBC     Status: Abnormal   Collection Time   04/07/11  5:45 AM      Component Value Range Comment   WBC 6.7  4.0 - 10.5 (K/uL)    RBC 6.77 (*) 4.22 - 5.81 (MIL/uL)    Hemoglobin 13.4  13.0 - 17.0 (g/dL)    HCT 76.7  34.1 - 93.7 (%)    MCV 58.8 (*) 78.0 - 100.0 (fL)    MCH 19.8 (*) 26.0 - 34.0 (pg)    MCHC 33.7  30.0 - 36.0 (g/dL)    RDW 90.2 (*) 40.9 - 15.5 (%)    Platelets 232  150 - 400 (K/uL)   COMPREHENSIVE METABOLIC PANEL     Status: Abnormal   Collection Time   04/07/11  5:45 AM      Component Value Range Comment   Sodium 139  135 - 145 (mEq/L)    Potassium 4.2  3.5 - 5.1 (mEq/L)    Chloride 105  96 - 112 (mEq/L)    CO2 23  19 - 32 (mEq/L)    Glucose, Bld 108 (*) 70 - 99 (mg/dL)    BUN 12  6 - 23 (mg/dL)    Creatinine, Ser 7.35  0.50 - 1.35 (mg/dL)    Calcium 8.8  8.4 - 10.5 (mg/dL)    Total Protein 7.4  6.0 - 8.3 (g/dL)    Albumin 3.3 (*) 3.5 - 5.2 (g/dL)    AST 33  0 - 37 (U/L)    ALT 30  0 - 53 (U/L)    Alkaline Phosphatase 81  39 - 117 (U/L)    Total Bilirubin 0.5  0.3 - 1.2 (mg/dL)    GFR calc non Af Amer 89 (*) >90 (mL/min)    GFR calc Af Amer >90  >90 (mL/min)     DIFFERENTIAL     Status: Normal   Collection Time   04/07/11  5:45 AM      Component Value Range Comment   Neutrophils Relative 58  43 - 77 (%)    Lymphocytes Relative 29  12 - 46 (%)    Monocytes Relative 9  3 - 12 (%)    Eosinophils Relative 3  0 - 5 (%)    Basophils Relative 1  0 - 1 (%)    Neutro Abs 3.9  1.7 - 7.7 (K/uL)    Lymphs Abs 1.9  0.7 - 4.0 (K/uL)    Monocytes Absolute 0.6  0.1 - 1.0 (K/uL)    Eosinophils Absolute 0.2  0.0 - 0.7 (K/uL)    Basophils Absolute 0.1  0.0 - 0.1 (K/uL)    RBC Morphology TARGET CELLS  HENT:  Head: Normocephalic.  Neck: Normal range of motion. No thyromegaly present.  Cardiovascular: Normal rate and regular rhythm.  Pulmonary/Chest: Breath sounds normal. He has no wheezes.  Abdominal: He exhibits no distension. There is no tenderness.  Musculoskeletal: He exhibits no edema.  Neurological: He is alert.  Follows basic two-step motor commands. Answers simple questions. There is a language barrier but patient can communicate his needs. Patient with 2-/5 strength intermittently  right biceps. He is 2- out of 5 right lower extremity . Is a right central 7 and tongue deviation. Speech is slightly less dysarthric. Sensation is right arm and leg Mildly diminished. Reflexes are 1+ throughout.  Skin: Skin is warm and dry.  Psychiatric: He has a normal mood and affect  CN central 7 on R  Assessment/Plan: 1. Functional deficits secondary to L pontine infarct causing R hemiparesis which require 3+ hours per day of interdisciplinary therapy in a comprehensive inpatient rehab setting. Physiatrist is providing close team supervision and 24 hour management of active medical problems listed below. Physiatrist and rehab team continue to assess barriers to discharge/monitor patient progress toward functional and medical goals.   2. Anticoagulation/DVT prophylaxis with Pharmaceutical: Lovenox 3. Pain Management: Tylenol  Medical Problem List and Plan:  1.  left paracentral pontine infarction. Continue aspirin therapy  2. DVT Prophylaxis/Anticoagulation: Subcutaneous Lovenox. Following platelets.h/h stable 3. Dysphagia. Regular diet/ thin liquids. Tolerating well. Appetite improved 4. Hyperlipidemia. Crestor  5. Hypertension. Currently on no antihypertensive medications. Will monitor the increased activity  6. Coronary artery disease. CABG 06. Continue aspirin  7. Medical noncompliance. Provide counseling and discuss with family.   LOS (Days) 2 A FACE TO FACE EVALUATION WAS PERFORMED

## 2011-04-14 NOTE — Progress Notes (Signed)
Occupational Therapy Session Note  Patient Details  Name: Edward Choi MRN: [id number] Date of Birth: [birth year]  Today's Date: 04/14/2011 Time: 4098-1191 Time Calculation (min): 44 min  Precautions: Precautions Precautions: Fall Precaution Comments: requal diet with thin liquids, no straw, right hemiplegia Required Braces or Orthoses: No Restrictions Weight Bearing Restrictions: No  Short Term Goals: OT Short Term Goal 1: Patient will bathe with supervision in shower in sit and stand OT Short Term Goal 1 - Progress: Met OT Short Term Goal 2: Patient will dress UB & LB with supervision in sit and stand OT Short Term Goal 2 - Progress: Partly met (patient has only had clothes for 2 days) OT Short Term Goal 3: Patient will perform toilet transfers and toileting with supervision OT Short Term Goal 3 - Progress: Met OT Short Term Goal 4: Patient will use RUE as a stabilizer during all ADL tasks with min cues OT Short Term Goal 4 - Progress: Met OT Short Term Goal 5: Pt will demonstrate self ROM exercises for RUE with minimal cues. OT Short Term Goal 5 - Progress: Met  Skilled Therapeutic Interventions/Progress Updates:    Worked on neuromuscular re-education for the RUE and LE.  Emphasis on closed chain weightbearing in sitting and transitioning to squating with increased midline or slightly left weight shift.  Placed the LUE in weightbearing during these movement to help encourage active shoulder and elbow to help maintain balance.  Pt with very minimal activation noted in these positions.  Transitioned to slanted stool to increase functional movement in the LUE with emphasis on decreasing compensation strategies in the trunk and shoulder.  Pt still tries to compensate with his trunk with all LUE movement.    Pain Pain Assessment Pain Assessment: No/denies pain   Therapy/Group: Individual Therapy  Rami Budhu OTR/L 04/14/2011, 3:30 PM

## 2011-04-14 NOTE — Progress Notes (Signed)
Physical Therapy Weekly Progress Note  Patient Details  Name: Edward Choi MRN: [id number] Date of Birth: [birth year]  Today's Date: 04/14/2011 Time: 4098-1191 and 1130-1202 Time Calculation (min): 55 min and 32 min  Patient is making good progress toward PT STG and LTG and has met 4 of 5 short term goals.  Patient is currently min-mod A overall for w/c mobility on unit, basic transfers, bed mobility flat bed and requires mod-max A for gait and stairs.  Patient will likely benefit from R foot orthosis to assist with toe and foot clearance and knee support for increased safety and decreased falls risk.    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  Patient progressing toward long term goals..  Continue plan of care.  D/C date set for 2/14.  PT Short Term Goals PT Short Term Goal 1: Patient will perform bed mobility flat bed to L and R with consistent min A with minimal verbal and tactile cues. PT Short Term Goal 1 - Progress: Progressing toward goal PT Short Term Goal 2: Patient will perform bed  <> chair transfers to L and R with consistent min A with intermittent verbal and tactile cues. PT Short Term Goal 2 - Progress: Met PT Short Term Goal 3: Patient will perform w/c mobility on unit with L hemi technique with min A PT Short Term Goal 3 - Progress: Met PT Short Term Goal 4: Patient will perform gait training x 100' with LRAD and mod A. PT Short Term  Goal 4 - Progress: Met PT Short Term Goal 5: Patient will perform up and down 3 stairs with one rail and mod A for home entry/exit. PT Short Term Goal 5 - Progress: Met  Therapy Documentation Precautions: Precautions Precautions: Fall Precaution Comments: requal diet with thin liquids, no straw, right hemiplegia Required Braces or Orthoses: No Restrictions Weight Bearing Restrictions: No  Pain Pain Assessment Pain Score: 0-No pain  Locomotion  Ambulation Ambulation: Yes Ambulation/Gait Assistance: 3: Mod assist Ambulation Distance (Feet): 25 Feet Assistive device: Rolling walker;Other (Comment) (R AFO and wall rail for LUE) Ambulation/Gait Assistance Details: Tactile cues for sequencing;Tactile cues for weight shifting;Tactile cues for posture;Tactile cues for placement;Tactile cues for weight beaing;Visual cues/gestures for sequencing;Verbal cues for sequencing;Verbal cues for gait pattern;Manual facilitation for weight shifting;Manual facilitation for placement;Manual facilitation for weight bearing Ambulation/Gait Assistance Details (indicate cue type and reason): Began with RW with hand splint and blue rocker on R foot x 25' with mod A with increased trunk forward and lateral flexion and decreased R stance time secondary to inability for tibia to translate over foot during mid and terminal stance.  Changed to plastic AFO R foot and use of wall rail for LUE with verbal cues for upright trunk and manual facilitation of R hip, knee and foot for full advancement, placement, hip IR and activaiton of extensors during initial stance and full translation of COG over R stance LE during forwards and retro stepping. High Level Ambulation High Level Ambulation: Backwards walking Stairs / Additional Locomotion Stairs: Yes Stairs Assistance: 3:  Mod assist Stairs Assistance Details: Tactile cues for sequencing;Tactile cues for posture;Tactile cues for placement;Visual cues/gestures for  precautions/safety;Visual cues/gestures for sequencing;Verbal cues for sequencing;Verbal cues for gait pattern Stairs Assistance Details (indicate cue type and reason): Stairs with LUE support with HHA and R AFO with mod A for lateral weight shifting, full advancement of RLE to next step and activation of hip and knee extensors for stability in stance Stair Management Technique: No rails;Step to pattern;Forwards;Other (comment) (HHA) Number of Stairs: 2  Height of Stairs: 6  Wheelchair Mobility Wheelchair Mobility: Yes Wheelchair Assistance: 5: Supervision Wheelchair Propulsion: Left lower extremity;Left upper extremity Wheelchair Parts Management: Needs assistance Distance: 150   Other Treatments Treatments Neuromuscular Facilitation: Right;Lower Extremity;Activity to increase motor control;Activity to increase timing and sequencing;Activity to increase sustained activation;Activity to increase lateral weight shifting;Activity to increase anterior-posterior weight shifting on Kinetron in static standing with manual and verbal cues for lateral and anterior weight shift to bring COG in midline and maintain trunk and pelvis in neutral and activation of RLE extensors and dynamic weight shifting side to side during LE extension with manual facilitation to maintain upright trunk and COG over BOS.   Second session performed set up of Bioness electrical stimulation of R ankle DF muscle group for activation training, strengthening and eventually gait training.   Therapy/Group: Individual Therapy  [name] 04/14/2011, 11:18 AM

## 2011-04-15 NOTE — Progress Notes (Signed)
Patient ID: Edward Choi, male   DOB: 1951-03-03, 61 y.o.   MRN: [id number] Patient ID: Edward Choi, male   DOB: 1951/01/22, 61 y.o.   MRN: [id number] Limited English but indicates no complaints. Able to move R UE more.Encouraged with progress Subjective/Complaints: Having bowel movement everyday per pt. Review of Systems  Respiratory: Negative.   Cardiovascular: Negative.   Gastrointestinal: Negative for constipation.  Neurological: Positive for focal weakness.   Objective: Vital Signs: Blood pressure 114/78, pulse 99, temperature 98 F (36.7 C), temperature source Oral, resp. rate 16, SpO2 93.00%. No results found. Results for orders placed during the hospital encounter of 04/06/11 (from the past 72 hour(s))  MRSA PCR SCREENING     Status: Normal   Collection Time   04/06/11  5:11 PM      Component Value Range Comment   MRSA by PCR NEGATIVE  NEGATIVE    CBC     Status: Abnormal   Collection Time   04/07/11  5:45 AM      Component Value Range Comment   WBC 6.7  4.0 - 10.5 (K/uL)    RBC 6.77 (*) 4.22 - 5.81 (MIL/uL)    Hemoglobin 13.4  13.0 - 17.0 (g/dL)    HCT 78.2  95.6 - 21.3 (%)    MCV 58.8 (*) 78.0 - 100.0 (fL)    MCH 19.8 (*) 26.0 - 34.0 (pg)    MCHC 33.7  30.0 - 36.0 (g/dL)    RDW 08.6 (*) 57.8 - 15.5 (%)    Platelets 232  150 - 400 (K/uL)   COMPREHENSIVE METABOLIC PANEL     Status: Abnormal   Collection Time   04/07/11  5:45 AM      Component Value Range Comment   Sodium 139  135 - 145 (mEq/L)    Potassium 4.2  3.5 - 5.1 (mEq/L)    Chloride 105  96 - 112 (mEq/L)    CO2 23  19 - 32 (mEq/L)    Glucose, Bld 108 (*) 70 - 99 (mg/dL)    BUN 12  6 - 23 (mg/dL)    Creatinine, Ser 4.69  0.50 - 1.35 (mg/dL)    Calcium 8.8  8.4 - 10.5 (mg/dL)    Total Protein 7.4  6.0 - 8.3 (g/dL)    Albumin 3.3 (*) 3.5 - 5.2 (g/dL)    AST 33  0 - 37 (U/L)    ALT 30  0 - 53 (U/L)    Alkaline Phosphatase 81  39 - 117 (U/L)    Total Bilirubin 0.5  0.3 - 1.2 (mg/dL)    GFR calc  non Af Amer 89 (*) >90 (mL/min)    GFR calc Af Amer >90  >90 (mL/min)   DIFFERENTIAL     Status: Normal   Collection Time   04/07/11  5:45 AM      Component Value Range Comment   Neutrophils Relative 58  43 - 77 (%)    Lymphocytes Relative 29  12 - 46 (%)    Monocytes Relative 9  3 - 12 (%)    Eosinophils Relative 3  0 - 5 (%)    Basophils Relative 1  0 - 1 (%)    Neutro Abs 3.9  1.7 - 7.7 (K/uL)    Lymphs Abs 1.9  0.7 - 4.0 (K/uL)    Monocytes Absolute 0.6  0.1 - 1.0 (K/uL)    Eosinophils Absolute 0.2  0.0 - 0.7 (K/uL)    Basophils Absolute 0.1  0.0 - 0.1 (K/uL)    RBC Morphology TARGET CELLS      HENT:  Head: Normocephalic.  Neck: Normal range of motion. No thyromegaly present.  Cardiovascular: Normal rate and regular rhythm.  Pulmonary/Chest: Breath sounds normal. He has no wheezes.  Abdominal: He exhibits no distension. There is no tenderness.  Musculoskeletal: He exhibits no edema.  Neurological: He is alert.  Follows basic two-step motor commands. Answers simple questions. There is a language barrier but patient can communicate his needs. Patient with 2-/5 strength intermittently  right biceps. He is 2- out of 5 right lower extremity . Is a right central 7 and tongue deviation. Speech is slightly less dysarthric. Sensation is right arm and leg Mildly diminished. Reflexes are 1+ throughout.  Skin: Skin is warm and dry.  Psychiatric: He has a normal mood and affect  CN central 7 on R  Assessment/Plan: 1. Functional deficits secondary to L pontine infarct causing R hemiparesis which require 3+ hours per day of interdisciplinary therapy in a comprehensive inpatient rehab setting. Physiatrist is providing close team supervision and 24 hour management of active medical problems listed below. Physiatrist and rehab team continue to assess barriers to discharge/monitor patient progress toward functional and medical goals.   2. Anticoagulation/DVT prophylaxis with Pharmaceutical:  Lovenox 3. Pain Management: Tylenol  Medical Problem List and Plan:  1. left paracentral pontine infarction. Continue aspirin therapy  2. DVT Prophylaxis/Anticoagulation: Subcutaneous Lovenox. Following platelets.h/h stable 3. Dysphagia. Regular diet/ thin liquids. Tolerating well. Appetite improved 4. Hyperlipidemia. Crestor  5. Hypertension. Currently on no antihypertensive medications. Will monitor the increased activity  6. Coronary artery disease. CABG 06. Continue aspirin  7. Medical noncompliance. Provide counseling and discuss with family.   LOS (Days) 2 A FACE TO FACE EVALUATION WAS PERFORMED

## 2011-04-15 NOTE — Progress Notes (Signed)
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  Patient progressing toward long term goals. Continue plan of care. D/C date set for 2/14.   Individual Therapy Session   Jodelle Gross 04/15/2011, 7:51 AM

## 2011-04-16 NOTE — Progress Notes (Signed)
Occupational Therapy Note  Patient Details  Name: Edward Choi MRN: [id number] Date of Birth: 1951/03/04 Today's Date: 04/16/2011 Time:1500-1540  (40 min) Individual session Pain:  None Pt. Engaged in RUE neuromuscular reeducation, therapeutic activities to facilitate more independence in bathing and dressing and feeding self with dominant hand.  Pt required manual facilitation and cues to for activities.  Engaged in force use of arm, and AAROM.      [name] 04/16/2011, 6:23 PM

## 2011-04-16 NOTE — Progress Notes (Signed)
Patient ID: Edward Choi, male   DOB: 1951-03-11, 61 y.o.   MRN: [id number] Patient ID: Edward Choi, male   DOB: 01-Jun-1950, 61 y.o.   MRN: [id number] Patient ID: Edward Choi, male   DOB: [birth year], 61 y.o.   MRN: [id number] Limited English but indicates no complaints. Able to move R UE more. Encouraged with progress Subjective/Complaints: Having bowel movement everyday per pt. Review of Systems  Respiratory: Negative.   Cardiovascular: Negative.   Gastrointestinal: Negative for constipation.  Neurological: Positive for focal weakness.   Objective: Vital Signs: Blood pressure 114/78, pulse 99, temperature 98 F (36.7 C), temperature source Oral, resp. rate 16, SpO2 93.00%. No results found. Results for orders placed during the hospital encounter of 04/06/11 (from the past 72 hour(s))  MRSA PCR SCREENING     Status: Normal   Collection Time   04/06/11  5:11 PM      Component Value Range Comment   MRSA by PCR NEGATIVE  NEGATIVE    CBC     Status: Abnormal   Collection Time   04/07/11  5:45 AM      Component Value Range Comment   WBC 6.7  4.0 - 10.5 (K/uL)    RBC 6.77 (*) 4.22 - 5.81 (MIL/uL)    Hemoglobin 13.4  13.0 - 17.0 (g/dL)    HCT 08.6  57.8 - 46.9 (%)    MCV 58.8 (*) 78.0 - 100.0 (fL)    MCH 19.8 (*) 26.0 - 34.0 (pg)    MCHC 33.7  30.0 - 36.0 (g/dL)    RDW 62.9 (*) 52.8 - 15.5 (%)    Platelets 232  150 - 400 (K/uL)   COMPREHENSIVE METABOLIC PANEL     Status: Abnormal   Collection Time   04/07/11  5:45 AM      Component Value Range Comment   Sodium 139  135 - 145 (mEq/L)    Potassium 4.2  3.5 - 5.1 (mEq/L)    Chloride 105  96 - 112 (mEq/L)    CO2 23  19 - 32 (mEq/L)    Glucose, Bld 108 (*) 70 - 99 (mg/dL)    BUN 12  6 - 23 (mg/dL)    Creatinine, Ser 4.13  0.50 - 1.35 (mg/dL)    Calcium 8.8  8.4 - 10.5 (mg/dL)    Total Protein 7.4  6.0 - 8.3 (g/dL)    Albumin 3.3 (*) 3.5 - 5.2 (g/dL)    AST 33  0 - 37 (U/L)    ALT 30  0 - 53 (U/L)    Alkaline  Phosphatase 81  39 - 117 (U/L)    Total Bilirubin 0.5  0.3 - 1.2 (mg/dL)    GFR calc non Af Amer 89 (*) >90 (mL/min)    GFR calc Af Amer >90  >90 (mL/min)   DIFFERENTIAL     Status: Normal   Collection Time   04/07/11  5:45 AM      Component Value Range Comment   Neutrophils Relative 58  43 - 77 (%)    Lymphocytes Relative 29  12 - 46 (%)    Monocytes Relative 9  3 - 12 (%)    Eosinophils Relative 3  0 - 5 (%)    Basophils Relative 1  0 - 1 (%)    Neutro Abs 3.9  1.7 - 7.7 (K/uL)    Lymphs Abs 1.9  0.7 - 4.0 (K/uL)    Monocytes Absolute 0.6  0.1 - 1.0 (K/uL)  Eosinophils Absolute 0.2  0.0 - 0.7 (K/uL)    Basophils Absolute 0.1  0.0 - 0.1 (K/uL)    RBC Morphology TARGET CELLS      HENT:  Head: Normocephalic.  Neck: Normal range of motion. No thyromegaly present.  Cardiovascular: Normal rate and regular rhythm.  Pulmonary/Chest: Breath sounds normal. He has no wheezes.  Abdominal: He exhibits no distension. There is no tenderness.  Musculoskeletal: He exhibits no edema.  Neurological: He is alert.  Follows basic two-step motor commands. Answers simple questions. There is a language barrier but patient can communicate his needs. Patient with 2-/5 strength intermittently  right biceps. He is 2- out of 5 right lower extremity . Is a right central 7 and tongue deviation. Speech is slightly less dysarthric. Sensation is right arm and leg Mildly diminished. Reflexes are 1+ throughout.  Skin: Skin is warm and dry.  Psychiatric: He has a normal mood and affect  CN central 7 on R  Assessment/Plan: 1. Functional deficits secondary to L pontine infarct causing R hemiparesis which require 3+ hours per day of interdisciplinary therapy in a comprehensive inpatient rehab setting. Physiatrist is providing close team supervision and 24 hour management of active medical problems listed below. Physiatrist and rehab team continue to assess barriers to discharge/monitor patient progress toward  functional and medical goals.   2. Anticoagulation/DVT prophylaxis with Pharmaceutical: Lovenox 3. Pain Management: Tylenol  Medical Problem List and Plan:  1. left paracentral pontine infarction. Continue aspirin therapy  2. DVT Prophylaxis/Anticoagulation: Subcutaneous Lovenox. Following platelets.h/h stable 3. Dysphagia. Regular diet/ thin liquids. Tolerating well. Appetite improved 4. Hyperlipidemia. Crestor  5. Hypertension. Currently on no antihypertensive medications. Will monitor the increased activity  6. Coronary artery disease. CABG 06. Continue aspirin  7. Medical noncompliance. Provide counseling and discuss with family.   LOS (Days) 2 A FACE TO FACE EVALUATION WAS PERFORMED

## 2011-04-17 NOTE — Plan of Care (Signed)
Problem: RH BOWEL ELIMINATION Goal: RH STG MANAGE BOWEL WITH ASSISTANCE STG Manage Bowel with Assistance.modified independence  Outcome: Progressing Pt is continent of Bowel. To the BR via W/C, then stand pivot to toilet. Pt does his own hygiene and clothes, occassional assist with clothes on right. Right hand /arm very weak.

## 2011-04-17 NOTE — Plan of Care (Signed)
Problem: RH BLADDER ELIMINATION Goal: RH STG MANAGE BLADDER WITH ASSISTANCE STG Manage Bladder With Assistance modified independence  Outcome: Progressing Toileting patient during the day, W/C to Bathroom, voids on toilet. Per report at night patient may use urinal with staff emptying.

## 2011-04-17 NOTE — Progress Notes (Signed)
Physical Therapy Session Note  Patient Details  Name: Edward Choi MRN: [id number] Date of Birth: 1951-01-06  Today's Date: 04/17/2011 Time: 0904-1000 Time Calculation (min): 56 min  Precautions: Precautions Precautions: Fall Precaution Comments: requal diet with thin liquids, no straw, right hemiplegia Required Braces or Orthoses: No Restrictions Weight Bearing Restrictions: No  Short Term Goals: PT Short Term Goal 1: Patient will perform bed mobility flat bed to L and R with consistent min A with minimal verbal and tactile cues. PT Short Term Goal 1 - Progress: Progressing toward goal PT Short Term Goal 2: Patient will perform bed  <> chair transfers to L and R with consistent min A with intermittent verbal and tactile cues. PT Short Term Goal 2 - Progress: Met PT Short Term Goal 3: Patient will perform w/c mobility on unit with L hemi technique with min A PT Short Term Goal 3 - Progress: Met PT Short Term Goal 4: Patient will perform gait training x 100' with LRAD and mod A. PT Short Term Goal 4 - Progress: Met PT Short Term Goal 5: Patient will perform up and down 3 stairs with one rail and mod A for home entry/exit. PT Short Term Goal 5 - Progress: Met  Pain Pain Assessment Pain Assessment: No/denies pain Pain Score: 0-No pain Locomotion  Ambulation Ambulation: Yes Ambulation/Gait Assistance: 3: Mod assist Ambulation Distance (Feet): 25 Feet ( x 2) Assistive device: Other (Comment) (Bioness and Wall rail) Ambulation/Gait Assistance Details: Manual facilitation for weight shifting;Manual facilitation for placement;Manual facilitation for weight bearing;Tactile cues for posture;Verbal cues for sequencing;Verbal cues for technique;Verbal cues for gait pattern Ambulation/Gait Assistance Details (indicate cue type and reason): Added Bioness estim to RLE for DF assist with use of wall rail as LUE support during fowards, retro and lateral stepping; still required Mod A for  RLE advancement, heel strike, hip IR, full anterior translation of COG over RLE and activation of core and LE extensors with cues to minimize lateral and forward trunk lean. High Level Ambulation High Level Ambulation: Backwards walking;Side stepping Side Stepping: With Bioness RLE and mod A Backwards Walking: With Bioness RLE and mod A Wheelchair Mobility Distance: 150 supervision with L hemi technique    Therapy/Group: Individual Therapy  [name] [facility] 04/17/2011, 12:19 PM

## 2011-04-17 NOTE — Progress Notes (Signed)
Occupational Therapy Session Note  Patient Details  Name: Edward Choi MRN: [id number] Date of Birth: [birth year]  Today's Date: 04/17/2011 Time: 1001-1100 Time Calculation (min): 59 min  Precautions: Precautions Precautions: Fall Precaution Comments: requal diet with thin liquids, no straw, right hemiplegia Required Braces or Orthoses: No Restrictions Weight Bearing Restrictions: No  Short Term Goals: OT Short Term Goal 1: Patient will bathe with supervision in shower in sit and stand OT Short Term Goal 1 - Progress: Met OT Short Term Goal 2: Patient will dress UB & LB with supervision in sit and stand OT Short Term Goal 2 - Progress: Partly met (patient has only had clothes for 2 days) OT Short Term Goal 3: Patient will perform toilet transfers and toileting with supervision OT Short Term Goal 3 - Progress: Met OT Short Term Goal 4: Patient will use RUE as a stabilizer during all ADL tasks with min cues OT Short Term Goal 4 - Progress: Met OT Short Term Goal 5: Pt will demonstrate self ROM exercises for RUE with minimal cues. OT Short Term Goal 5 - Progress: Met  Skilled Therapeutic Interventions/Progress Updates:    Pt worked on Recruitment consultant.  Performed showering sitting on tub bench.  Pt somewhat impulsive during bathing likes to go fast.  Continues to need mod instructional cues for positioning of the LUE in lap before and after transfers.  Total assist needed to utilize the LUE as an active assist for any bathing, however pt attempts to use as a stabilizer for grooming tasks.  Min assist for transfer with RW to shower bench.  Pt with decreased ability to maintain neutral foot alignment with stepping on the right side.  Also worked on [employer] exercises for the LUE in supine.  Pt able to elicit active elbow flexion and extension against gravity but still in a significant synergy pattern.   Pain Pain Assessment Pain Assessment: No/denies pain Pain Score: 0-No  pain ADL See FIM section  Therapy/Group: Individual Therapy  Demonica Farrey OTR/L 04/17/2011, 12:10 PM

## 2011-04-17 NOTE — Progress Notes (Signed)
Occupational Therapy Session Note  Patient Details  Name: Edward Choi MRN: [id number] Date of Birth: August 26, 1950  Today's Date: 04/17/2011 Time: 4098-1191 Time Calculation (min): 45 min  Precautions: Precautions Precautions: Fall Precaution Comments: requal diet with thin liquids, no straw, right hemiplegia Required Braces or Orthoses: No Restrictions Weight Bearing Restrictions: No  Short Term Goals: OT Short Term Goal 1: Patient will bathe with supervision in shower in sit and stand OT Short Term Goal 1 - Progress: Met OT Short Term Goal 2: Patient will dress UB & LB with supervision in sit and stand OT Short Term Goal 2 - Progress: Partly met (patient has only had clothes for 2 days) OT Short Term Goal 3: Patient will perform toilet transfers and toileting with supervision OT Short Term Goal 3 - Progress: Met OT Short Term Goal 4: Patient will use RUE as a stabilizer during all ADL tasks with min cues OT Short Term Goal 4 - Progress: Met OT Short Term Goal 5: Pt will demonstrate self ROM exercises for RUE with minimal cues. OT Short Term Goal 5 - Progress: Met  Skilled Therapeutic Interventions/Progress Updates:    Worked on neuro muscular re-education with emphasis on weightbearing and weightshifts in quadriped.  Pt needs mod facilitation to maintain weightbearing on the RUE while achieving some left elbow activation.  Unable to extend elbow fully while reaching with the opposite hand.  Worked on sliding the left hand forward on the mat while supporting himself with the right.  While resting the UE had pt work on maintaining his balance while performing tall kneeling.  Transitioned to sitting and worked on side sitting to sitting on the right side with emphasis on right trunk and UE activation.  Pain Pain Assessment Pain Assessment: No/denies pain   Therapy/Group: Individual Therapy  [name] OTR/L 04/17/2011, 3:15 PM

## 2011-04-17 NOTE — Progress Notes (Signed)
Occupational Therapy Session Note  Patient Details  Name: Edward Choi MRN: [id number] Date of Birth: [birth year]  Today's Date: 04/17/2011 Time: 7829-5621 Time Calculation (min): 28 min  Precautions: Precautions Precautions: Fall Precaution Comments: requal diet with thin liquids, no straw, right hemiplegia Required Braces or Orthoses: No Restrictions Weight Bearing Restrictions: No  Skilled Therapeutic Interventions/Progress Updates:   Patient was seen for OT this pm to further address neuromuscular re-education to trunk and right upper extremity.  Patient with trace finger flexion noted after weight bearing with minimal facilitation.   Worked initially in gravity eliminated position to isolate shoulder and elbow motion.  Patient able to activate shoulder flexion / extension, and elbow flexion / extension, but had difficulty sustaining muscle activity.      Pain After weight bearing through extended right arm, patient with report of pain (mild) in right elbow crease.  Therapy/Group: Individual Therapy  Collier Salina 04/17/2011, 2:41 PM

## 2011-04-17 NOTE — Progress Notes (Signed)
Patient ID: Edward Choi, male   DOB: 02/05/1951, 61 y.o.   MRN: [id number] Limited English but indicates no complaints. Able to move R UE more.Encouraged with progress Subjective/Complaints: Having bowel movement everyday per pt. Review of Systems  Respiratory: Negative.   Cardiovascular: Negative.   Gastrointestinal: Negative for constipation.  Neurological: Positive for focal weakness.   Objective: Vital Signs: Blood pressure 114/78, pulse 99, temperature 98 F (36.7 C), temperature source Oral, resp. rate 16, SpO2 93.00%. No results found. Results for orders placed during the hospital encounter of 04/06/11 (from the past 72 hour(s))  MRSA PCR SCREENING     Status: Normal   Collection Time   04/06/11  5:11 PM      Component Value Range Comment   MRSA by PCR NEGATIVE  NEGATIVE    CBC     Status: Abnormal   Collection Time   04/07/11  5:45 AM      Component Value Range Comment   WBC 6.7  4.0 - 10.5 (K/uL)    RBC 6.77 (*) 4.22 - 5.81 (MIL/uL)    Hemoglobin 13.4  13.0 - 17.0 (g/dL)    HCT 40.9  81.1 - 91.4 (%)    MCV 58.8 (*) 78.0 - 100.0 (fL)    MCH 19.8 (*) 26.0 - 34.0 (pg)    MCHC 33.7  30.0 - 36.0 (g/dL)    RDW 78.2 (*) 95.6 - 15.5 (%)    Platelets 232  150 - 400 (K/uL)   COMPREHENSIVE METABOLIC PANEL     Status: Abnormal   Collection Time   04/07/11  5:45 AM      Component Value Range Comment   Sodium 139  135 - 145 (mEq/L)    Potassium 4.2  3.5 - 5.1 (mEq/L)    Chloride 105  96 - 112 (mEq/L)    CO2 23  19 - 32 (mEq/L)    Glucose, Bld 108 (*) 70 - 99 (mg/dL)    BUN 12  6 - 23 (mg/dL)    Creatinine, Ser 2.13  0.50 - 1.35 (mg/dL)    Calcium 8.8  8.4 - 10.5 (mg/dL)    Total Protein 7.4  6.0 - 8.3 (g/dL)    Albumin 3.3 (*) 3.5 - 5.2 (g/dL)    AST 33  0 - 37 (U/L)    ALT 30  0 - 53 (U/L)    Alkaline Phosphatase 81  39 - 117 (U/L)    Total Bilirubin 0.5  0.3 - 1.2 (mg/dL)    GFR calc non Af Amer 89 (*) >90 (mL/min)    GFR calc Af Amer >90  >90 (mL/min)     DIFFERENTIAL     Status: Normal   Collection Time   04/07/11  5:45 AM      Component Value Range Comment   Neutrophils Relative 58  43 - 77 (%)    Lymphocytes Relative 29  12 - 46 (%)    Monocytes Relative 9  3 - 12 (%)    Eosinophils Relative 3  0 - 5 (%)    Basophils Relative 1  0 - 1 (%)    Neutro Abs 3.9  1.7 - 7.7 (K/uL)    Lymphs Abs 1.9  0.7 - 4.0 (K/uL)    Monocytes Absolute 0.6  0.1 - 1.0 (K/uL)    Eosinophils Absolute 0.2  0.0 - 0.7 (K/uL)    Basophils Absolute 0.1  0.0 - 0.1 (K/uL)    RBC Morphology TARGET CELLS  HENT:  Head: Normocephalic.  Neck: Normal range of motion. No thyromegaly present.  Cardiovascular: Normal rate and regular rhythm.  Pulmonary/Chest: Breath sounds normal. He has no wheezes.  Abdominal: He exhibits no distension. There is no tenderness.  Musculoskeletal: He exhibits no edema.  Neurological: He is alert.  Follows basic two-step motor commands. Answers simple questions. There is a language barrier but patient can communicate his needs. Patient with 2-/5 strength intermittently  right biceps. He is 3- out of 5 right lower extremity except 0/5 ankle DF . Is a right central 7 and tongue deviation. Speech is slightly less dysarthric. Sensation is right arm and leg Mildly diminished. Reflexes are 1+ throughout.  Skin: Skin is warm and dry.  Psychiatric: He has a normal mood and affect  CN central 7 on R  Assessment/Plan: 1. Functional deficits secondary to L pontine infarct causing R hemiparesis which require 3+ hours per day of interdisciplinary therapy in a comprehensive inpatient rehab setting. Physiatrist is providing close team supervision and 24 hour management of active medical problems listed below. Physiatrist and rehab team continue to assess barriers to discharge/monitor patient progress toward functional and medical goals.   2. Anticoagulation/DVT prophylaxis with Pharmaceutical: Lovenox 3. Pain Management: Tylenol  Medical Problem  List and Plan:  1. left paracentral pontine infarction. Continue aspirin therapy  2. DVT Prophylaxis/Anticoagulation: Subcutaneous Lovenox. Following platelets.h/h stable 3. Dysphagia. Regular diet/ thin liquids. Tolerating well. Appetite improved 4. Hyperlipidemia. Crestor  5. Hypertension. Currently on no antihypertensive medications. Will monitor the increased activity  6. Coronary artery disease. CABG 06. Continue aspirin  7. Medical noncompliance. Provide counseling and discuss with family.   LOS (Days) 2 A FACE TO FACE EVALUATION WAS PERFORMED

## 2011-04-17 NOTE — Plan of Care (Signed)
Problem: RH BOWEL ELIMINATION Goal: RH STG MANAGE BOWEL W/MEDICATION W/ASSISTANCE STG Manage Bowel with Medication with Assistance.min assist of caregiver  Outcome: Progressing Today patient refused Senokot tablet. Had Bm today.

## 2011-04-17 NOTE — Plan of Care (Signed)
Problem: RH SKIN INTEGRITY Goal: RH STG MAINTAIN SKIN INTEGRITY WITH ASSISTANCE STG Maintain Skin Integrity With Assistance.Min assist of caregiver  Outcome: Progressing Patient needs staff to apply ted hose and remove ted hose,

## 2011-04-18 NOTE — Progress Notes (Signed)
Patient ID: Edward Choi, male   DOB: 03/09/1951, 61 y.o.   MRN: [id number] Limited English but indicates no complaints. Able to move R UE more.Encouraged with progress Subjective/Complaints: Having bowel movement everyday per pt. Review of Systems  Respiratory: Negative.   Cardiovascular: Negative.   Gastrointestinal: Negative for constipation.  Neurological: Positive for focal weakness.   Objective: Vital Signs: Blood pressure 114/78, pulse 99, temperature 98 F (36.7 C), temperature source Oral, resp. rate 16, SpO2 93.00%. No results found. Results for orders placed during the hospital encounter of 04/06/11 (from the past 72 hour(s))  MRSA PCR SCREENING     Status: Normal   Collection Time   04/06/11  5:11 PM      Component Value Range Comment   MRSA by PCR NEGATIVE  NEGATIVE    CBC     Status: Abnormal   Collection Time   04/07/11  5:45 AM      Component Value Range Comment   WBC 6.7  4.0 - 10.5 (K/uL)    RBC 6.77 (*) 4.22 - 5.81 (MIL/uL)    Hemoglobin 13.4  13.0 - 17.0 (g/dL)    HCT 39.8  39.0 - 52.0 (%)    MCV 58.8 (*) 78.0 - 100.0 (fL)    MCH 19.8 (*) 26.0 - 34.0 (pg)    MCHC 33.7  30.0 - 36.0 (g/dL)    RDW 18.6 (*) 11.5 - 15.5 (%)    Platelets 232  150 - 400 (K/uL)   COMPREHENSIVE METABOLIC PANEL     Status: Abnormal   Collection Time   04/07/11  5:45 AM      Component Value Range Comment   Sodium 139  135 - 145 (mEq/L)    Potassium 4.2  3.5 - 5.1 (mEq/L)    Chloride 105  96 - 112 (mEq/L)    CO2 23  19 - 32 (mEq/L)    Glucose, Bld 108 (*) 70 - 99 (mg/dL)    BUN 12  6 - 23 (mg/dL)    Creatinine, Ser 0.93  0.50 - 1.35 (mg/dL)    Calcium 8.8  8.4 - 10.5 (mg/dL)    Total Protein 7.4  6.0 - 8.3 (g/dL)    Albumin 3.3 (*) 3.5 - 5.2 (g/dL)    AST 33  0 - 37 (U/L)    ALT 30  0 - 53 (U/L)    Alkaline Phosphatase 81  39 - 117 (U/L)    Total Bilirubin 0.5  0.3 - 1.2 (mg/dL)    GFR calc non Af Amer 89 (*) >90 (mL/min)    GFR calc Af Amer >90  >90 (mL/min)     DIFFERENTIAL     Status: Normal   Collection Time   04/07/11  5:45 AM      Component Value Range Comment   Neutrophils Relative 58  43 - 77 (%)    Lymphocytes Relative 29  12 - 46 (%)    Monocytes Relative 9  3 - 12 (%)    Eosinophils Relative 3  0 - 5 (%)    Basophils Relative 1  0 - 1 (%)    Neutro Abs 3.9  1.7 - 7.7 (K/uL)    Lymphs Abs 1.9  0.7 - 4.0 (K/uL)    Monocytes Absolute 0.6  0.1 - 1.0 (K/uL)    Eosinophils Absolute 0.2  0.0 - 0.7 (K/uL)    Basophils Absolute 0.1  0.0 - 0.1 (K/uL)    RBC Morphology TARGET CELLS        HENT:  Head: Normocephalic.  Neck: Normal range of motion. No thyromegaly present.  Cardiovascular: Normal rate and regular rhythm.  Pulmonary/Chest: Breath sounds normal. He has no wheezes.  Abdominal: He exhibits no distension. There is no tenderness.  Musculoskeletal: He exhibits no edema.  Neurological: He is alert.  Follows basic two-step motor commands. Answers simple questions. There is a language barrier but patient can communicate his needs. Patient with 2-/5 strength intermittently  right biceps. He is 3- out of 5 right lower extremity except 0/5 ankle DF . Is a right central 7 and tongue deviation. Speech is slightly less dysarthric. Sensation is right arm and leg Mildly diminished. Reflexes are 1+ throughout.  Skin: Skin is warm and dry.  Psychiatric: He has a normal mood and affect  CN central 7 on R  Assessment/Plan: 1. Functional deficits secondary to L pontine infarct causing R hemiparesis which require 3+ hours per day of interdisciplinary therapy in a comprehensive inpatient rehab setting. Physiatrist is providing close team supervision and 24 hour management of active medical problems listed below. Physiatrist and rehab team continue to assess barriers to discharge/monitor patient progress toward functional and medical goals.   2. Anticoagulation/DVT prophylaxis with Pharmaceutical: Lovenox 3. Pain Management: Tylenol  Medical Problem  List and Plan:  1. left paracentral pontine infarction. Continue aspirin therapy  2. DVT Prophylaxis/Anticoagulation: Subcutaneous Lovenox. Following platelets.h/h stable 3. Dysphagia. Regular diet/ thin liquids. Tolerating well. Appetite improved 4. Hyperlipidemia. Crestor  5. Hypertension. Currently on no antihypertensive medications. Will monitor the increased activity  6. Coronary artery disease. CABG 06. Continue aspirin  7. Medical noncompliance. Provide counseling and discuss with family.   LOS (Days) 2 A FACE TO FACE EVALUATION WAS PERFORMED        

## 2011-04-18 NOTE — Progress Notes (Signed)
Patient ID: Edward Choi, male   DOB: [birth year], 61 y.o.   MRN: [id number] Clinical update faxed to [facility]

## 2011-04-18 NOTE — Progress Notes (Signed)
Occupational Therapy Session Note  Patient Details  Name: Edward Choi MRN: [id number] Date of Birth: 1950/11/05  Today's Date: 04/18/2011 Time: 1135-1202 Time Calculation (min): 27 min  Precautions: Precautions Precautions: Fall Precaution Comments: requal diet with thin liquids, no straw, right hemiplegia Required Braces or Orthoses: No Restrictions Weight Bearing Restrictions: No  Short Term Goals: OT Short Term Goal 1: Patient will bathe with supervision in shower in sit and stand OT Short Term Goal 1 - Progress: Met OT Short Term Goal 2: Patient will dress UB & LB with supervision in sit and stand OT Short Term Goal 2 - Progress: Partly met (patient has only had clothes for 2 days) OT Short Term Goal 3: Patient will perform toilet transfers and toileting with supervision OT Short Term Goal 3 - Progress: Met OT Short Term Goal 4: Patient will use RUE as a stabilizer during all ADL tasks with min cues OT Short Term Goal 4 - Progress: Met OT Short Term Goal 5: Pt will demonstrate self ROM exercises for RUE with minimal cues. OT Short Term Goal 5 - Progress: Met  Skilled Therapeutic Interventions/Progress Updates:  Engaged in NM re-ed with focus on RUE ROM in supine and sitting.  Focus on shoulder flexion, elbow flexion and extension, and supination and pronation.  Pt requires increased use of shoulder/scapula to conduct movements in RUE.  Focus on overall arm movements as well as isolation of muscles.  Pt required set up assist to open containers for lunch.  Pain Pain Assessment Pain Assessment: No/denies pain Pain Score: 0-No pain  Therapy/Group: Individual Therapy  Leonette Monarch 04/18/2011, 12:22 PM

## 2011-04-18 NOTE — Plan of Care (Signed)
Problem: RH BLADDER ELIMINATION Goal: RH STG MANAGE BLADDER WITH ASSISTANCE STG Manage Bladder With Assistance modified independence  Outcome: Progressing Using urinal during the day and night.   Problem: RH SAFETY Goal: RH STG ADHERE TO SAFETY PRECAUTIONS W/ASSISTANCE/DEVICE STG Adhere to Safety Precautions With Assistance/Device.supervision  Outcome: Progressing Min A

## 2011-04-18 NOTE — Progress Notes (Signed)
Occupational Therapy Session Note  Patient Details  Name: Edward Choi MRN: [id number] Date of Birth: [birth year]  Today's Date: 04/18/2011 Time: 1000-1100 Time Calculation (min): 60 min  Precautions: Precautions Precautions: Fall Precaution Comments: requal diet with thin liquids, no straw, right hemiplegia Required Braces or Orthoses: No Restrictions Weight Bearing Restrictions: No  Short Term Goals: OT Short Term Goal 1: Patient will bathe with supervision in shower in sit and stand OT Short Term Goal 1 - Progress: Met OT Short Term Goal 2: Patient will dress UB & LB with supervision in sit and stand OT Short Term Goal 2 - Progress: Partly met (patient has only had clothes for 2 days) OT Short Term Goal 3: Patient will perform toilet transfers and toileting with supervision OT Short Term Goal 3 - Progress: Met OT Short Term Goal 4: Patient will use RUE as a stabilizer during all ADL tasks with min cues OT Short Term Goal 4 - Progress: Met OT Short Term Goal 5: Pt will demonstrate self ROM exercises for RUE with minimal cues. OT Short Term Goal 5 - Progress: Met  Skilled Therapeutic Interventions/Progress Updates: Patient seen for ADL retraining with bathing/ dressing at shower level with a focus on RUE active movement, use of RUE as stabilizing assist, and symmetrical sit to stand.  Pt stood in shower with steady assist to wash bottom and used wash mit to bathe left arm with minimal assist.  Pt did well using hand as a support with holding washcloth on leg.  Pt then seen for RUE neuro re-ed with reciprocal arm glides on table, mirror therapy, hand over hand guiding with grasping cones, and elbow flexion/extension facilitation in supine.  PNF patterns in supine with focus shoulder abd/add.  Pt is demo. Progress with elbow active movement and static hold grasp of large, lightweight objects.           Pain Pain Assessment Pain Assessment: No/denies pain Pain Score: 0-No  pain ADL ADL Eating: Set up Where Assessed-Eating: Chair Grooming: Minimal assistance Where Assessed-Grooming: Standing at sink Upper Body Bathing: Minimal assistance Where Assessed-Upper Body Bathing: Shower Lower Body Bathing: Supervision/safety Where Assessed-Lower Body Bathing: Shower (sit and stand) Upper Body Dressing: Minimal assistance Where Assessed-Upper Body Dressing: Chair Lower Body Dressing: Minimal assistance Where Assessed-Lower Body Dressing: Chair (sit and stand) Toileting: Minimal assistance Where Assessed-Toileting: Teacher, adult education: Curator Method: Surveyor, minerals: Engineer, technical sales: Not assessed Film/video editor: Insurance underwriter Method: Warden/ranger: Transfer tub bench;Grab bars         Therapy/Group: Individual Therapy  SAGUIER,JULIA 04/18/2011, 11:50 AM

## 2011-04-18 NOTE — Progress Notes (Signed)
Physical Therapy Session Note  Patient Details  Name: Edward Choi MRN: [id number] Date of Birth: 1950/03/27  Today's Date: 04/18/2011 Time: [phone] and 9629-5284 Time Calculation (min): 56 min and 45 min  Precautions: Precautions Precautions: Fall Precaution Comments: requal diet with thin liquids, no straw, right hemiplegia Required Braces or Orthoses: No Restrictions Weight Bearing Restrictions: No  Short Term Goals: PT Short Term Goal 1: Patient will perform bed mobility flat bed to L and R with consistent min A with minimal verbal and tactile cues. PT Short Term Goal 1 - Progress: Progressing toward goal PT Short Term Goal 2: Patient will perform bed  <> chair transfers to L and R with consistent min A with intermittent verbal and tactile cues. PT Short Term Goal 2 - Progress: Met PT Short Term Goal 3: Patient will perform w/c mobility on unit with L hemi technique with min A PT Short Term Goal 3 - Progress: Met PT Short Term Goal 4: Patient will perform gait training x 100' with LRAD and mod A. PT Short Term Goal 4 - Progress: Met PT Short Term Goal 5: Patient will perform up and down 3 stairs with one rail and mod A for home entry/exit. PT Short Term Goal 5 - Progress: Met  Pain Pain Assessment Pain Assessment: No/denies pain Pain Score: 0-No pain  Locomotion  Ambulation Ambulation/Gait Assistance: 3: Mod assist Stairs / Additional Locomotion Stairs: Yes Stairs Assistance: 2: Max assist Stairs Assistance Details: Tactile cues for sequencing;Tactile cues for weight shifting;Tactile cues for placement;Visual cues/gestures for precautions/safety;Visual cues/gestures for sequencing;Verbal cues for sequencing;Verbal cues for technique;Verbal cues for gait pattern;Verbal cues for precautions/safety Stairs Assistance Details (indicate cue type and reason): Stairs with LUE support on rail with use of Bioness estim for DF assist on RLE with max A for full advancement of  RLE, placement and facilitation of hip IR and hip and knee extension for stabilization. Stair Management Technique: One rail Left;Step to pattern;Forwards;Other (comment) (Bioness RLE) Number of Stairs: 2  (x 3 reps) Height of Stairs: 6  Wheelchair Mobility Distance: 150   Other Treatments Treatments Neuromuscular Facilitation: Right;Lower Extremity;Activity to increase motor control;Activity to increase sustained activation;Activity to increase lateral weight shifting;Activity to increase anterior-posterior weight shifting during RLE single leg stance with mirror for visual feedback of trunk and RLE positioning, R and L lateral stepping in squat position and during RLE step ups on 6 inch step with use of Bioness estim R DF assist for increased foot clearance and manual facilitation for full anterior weight shift and activation of R glutes and quads for extension and stabilization;   PM session continue NMR with use of mirror therapy blocking RLE with visual feedback for proprioceptive input during various LLE movements; patient required verbal cues to attend to image in mirror.  NMR of RLE hip stabilization muscles during closed chain activation training in sitting against therapist's manual resistance and against external resistance of belt around LE during sit to stand with verbal and tactile cues to minimize LLE activation and trunk rotation to initiate movement.  Therapy/Group: Individual Therapy  [name] [facility] 04/18/2011, 12:17 PM

## 2011-04-19 NOTE — Plan of Care (Signed)
Problem: RH BLADDER ELIMINATION Goal: RH STG MANAGE BLADDER WITH ASSISTANCE STG Manage Bladder With Assistance modified independence  Outcome: Progressing Pt toileted during day to BR, evening and nights pt may use urinal and staff empties

## 2011-04-19 NOTE — Progress Notes (Signed)
Physical Therapy Session Note  Patient Details  Name: Edward Choi MRN: [id number] Date of Birth: 08-06-1950  Today's Date: 04/19/2011 Time: 1002-1046 Time Calculation (min): 44 min  Precautions: Precautions Precautions: Fall Precaution Comments: requal diet with thin liquids, no straw, right hemiplegia Required Braces or Orthoses: No Restrictions Weight Bearing Restrictions: No  Short Term Goals: PT Short Term Goal 1: Patient will perform bed mobility flat bed to L and R with consistent min A with minimal verbal and tactile cues. PT Short Term Goal 1 - Progress: Progressing toward goal PT Short Term Goal 2: Patient will perform bed  <> chair transfers to L and R with consistent min A with intermittent verbal and tactile cues. PT Short Term Goal 2 - Progress: Met PT Short Term Goal 3: Patient will perform w/c mobility on unit with L hemi technique with min A PT Short Term Goal 3 - Progress: Met PT Short Term Goal 4: Patient will perform gait training x 100' with LRAD and mod A. PT Short Term Goal 4 - Progress: Met PT Short Term Goal 5: Patient will perform up and down 3 stairs with one rail and mod A for home entry/exit. PT Short Term Goal 5 - Progress: Met  Skilled Therapeutic Interventions/Progress Updates: treatment focused on gait and transfer training, neuromuscular re-ed via weight bearing, tactile, visual and verbal cues. Therapeutic exercise in sitting for bil hip adductors, isometric 10 x 1.    In sitting with feet supported, progressing to feet unsupported, pt demonstrated R trunk shortening repeatedly, lengthening occasionally,  with facilitation, contralateral weight bearing.  In standing, biased toward RLE, LLE on 4" stool, pt required mod A for postural support during RUE task with max A.  Pt continues to demonstrate limited activation of RLE with wt bearing.  Gait training with RW with R hand support, x 72', x 40' with mod A for upright posture, R knee control, with ACE  wrap R ankle to compensate for lack of DF,   W/c >< mat/recliner with min A.        Pain Pain Assessment Pain Assessment: No/denies pain Mobility   Locomotion  Ambulation Ambulation/Gait Assistance: 4: Min assist      Therapy/Group: Individual Therapy  Myleen Brailsford 04/19/2011, 10:49 AM

## 2011-04-19 NOTE — Progress Notes (Signed)
Physical Therapy Session Note  Patient Details  Name: Edward Choi MRN: [id number] Date of Birth: 10-25-1950  Today's Date: 04/19/2011 Time: 1306-1400 Time Calculation (min): 54 min  Precautions: Precautions Precautions: Fall Precaution Comments: requal diet with thin liquids, no straw, right hemiplegia Required Braces or Orthoses: No Restrictions Weight Bearing Restrictions: No  Short Term Goals: PT Short Term Goal 1: Patient will perform bed mobility flat bed to L and R with consistent min A with minimal verbal and tactile cues. PT Short Term Goal 1 - Progress: Progressing toward goal PT Short Term Goal 2: Patient will perform bed  <> chair transfers to L and R with consistent min A with intermittent verbal and tactile cues. PT Short Term Goal 2 - Progress: Met PT Short Term Goal 3: Patient will perform w/c mobility on unit with L hemi technique with min A PT Short Term Goal 3 - Progress: Met PT Short Term Goal 4: Patient will perform gait training x 100' with LRAD and mod A. PT Short Term Goal 4 - Progress: Met PT Short Term Goal 5: Patient will perform up and down 3 stairs with one rail and mod A for home entry/exit. PT Short Term Goal 5 - Progress: Met  Pain Pain Assessment Pain Assessment: No/denies pain Locomotion  Wheelchair Mobility Distance: 150   Exercises  Supine RLE stretching/PROM to increase hip IR ROM and AAROM RLE into hip IR with tactile cues to maintain isometric hip IR activation and concentric contraction of RLE hip IR.    Other Treatments Treatments Neuromuscular Facilitation: Right;Lower Extremity;Forced use;Activity to increase motor control;Activity to increase timing and sequencing;Activity to increase sustained activation;Activity to increase lateral weight shifting;Activity to increase anterior-posterior weight shifting while sitting on stability ball beginning with bilat UE support and mod A to bring COG and maintain in midline with increased  activation of R trunk and RLE for stabilization progressing to dynamic UE movement with bilat UE forward flexion to decrease support of UE and lateral reaching to L to increase activation of RLE extension and R trunk lateral flexion, and progressed to one LE support through LLE hip flexion with manual cues for activation of RLE and core.  NMR during sit to stand with LLE elevated off floor with focus on weight shift to R, anterior lean over RLE for increased activation of RLE extensors to bring COG fully over RLE.  Therapy/Group: Individual Therapy  [name] [facility] 04/19/2011, 3:09 PM

## 2011-04-19 NOTE — Progress Notes (Signed)
Occupational Therapy Session Note  Patient Details  Name: Edward Choi MRN: [id number] Date of Birth: [birth year]  Today's Date: 04/19/2011 Time: 0805-0902 Time Calculation (min): 57 min  Precautions: Precautions Precautions: Fall Precaution Comments: requal diet with thin liquids, no straw, right hemiplegia Required Braces or Orthoses: No Restrictions Weight Bearing Restrictions: No  Short Term Goals: OT Short Term Goal 1: Patient will bathe with supervision in shower in sit and stand OT Short Term Goal 1 - Progress: Met OT Short Term Goal 2: Patient will dress UB & LB with supervision in sit and stand OT Short Term Goal 2 - Progress: Partly met (patient has only had clothes for 2 days) OT Short Term Goal 3: Patient will perform toilet transfers and toileting with supervision OT Short Term Goal 3 - Progress: Met OT Short Term Goal 4: Patient will use RUE as a stabilizer during all ADL tasks with min cues OT Short Term Goal 4 - Progress: Met OT Short Term Goal 5: Pt will demonstrate self ROM exercises for RUE with minimal cues. OT Short Term Goal 5 - Progress: Met  Skilled Therapeutic Interventions/Progress Updates: Patient seen for ADL retraining of bathing/ dressing shower level with toileting tasks.  Pt only required close supervision with all tasks except for minimal assist to start right sock and shoe.  Pt demonstrated improved balance in shower.  At sink in standing, tactile cues given to facilitate RLE extension and maintain foot in neutral.  RUE neuro re-ed with reciprocal gliding exercises, a/arom with sanding movements on table, facilitation of grasp and elbow flexion with cone grasping with mod assist.           Pain Pain Assessment Pain Assessment: No/denies pain    Therapy/Group: Individual Therapy  SAGUIER,JULIA 04/19/2011, 9:22 AM

## 2011-04-19 NOTE — Progress Notes (Signed)
Patient ID: Edward Choi, male   DOB: 10/30/1950, 60 y.o.   MRN: [id number] Limited English but indicates no complaints. Able to move R UE more.Encouraged with progress Subjective/Complaints: Having bowel movement everyday per pt. Review of Systems  Respiratory: Negative.   Cardiovascular: Negative.   Gastrointestinal: Negative for constipation.  Neurological: Positive for focal weakness.   Objective: Vital Signs: Blood pressure 114/78, pulse 99, temperature 98 F (36.7 C), temperature source Oral, resp. rate 16, SpO2 93.00%. No results found. Results for orders placed during the hospital encounter of 04/06/11 (from the past 72 hour(s))  MRSA PCR SCREENING     Status: Normal   Collection Time   04/06/11  5:11 PM      Component Value Range Comment   MRSA by PCR NEGATIVE  NEGATIVE    CBC     Status: Abnormal   Collection Time   04/07/11  5:45 AM      Component Value Range Comment   WBC 6.7  4.0 - 10.5 (K/uL)    RBC 6.77 (*) 4.22 - 5.81 (MIL/uL)    Hemoglobin 13.4  13.0 - 17.0 (g/dL)    HCT 39.8  39.0 - 52.0 (%)    MCV 58.8 (*) 78.0 - 100.0 (fL)    MCH 19.8 (*) 26.0 - 34.0 (pg)    MCHC 33.7  30.0 - 36.0 (g/dL)    RDW 18.6 (*) 11.5 - 15.5 (%)    Platelets 232  150 - 400 (K/uL)   COMPREHENSIVE METABOLIC PANEL     Status: Abnormal   Collection Time   04/07/11  5:45 AM      Component Value Range Comment   Sodium 139  135 - 145 (mEq/L)    Potassium 4.2  3.5 - 5.1 (mEq/L)    Chloride 105  96 - 112 (mEq/L)    CO2 23  19 - 32 (mEq/L)    Glucose, Bld 108 (*) 70 - 99 (mg/dL)    BUN 12  6 - 23 (mg/dL)    Creatinine, Ser 0.93  0.50 - 1.35 (mg/dL)    Calcium 8.8  8.4 - 10.5 (mg/dL)    Total Protein 7.4  6.0 - 8.3 (g/dL)    Albumin 3.3 (*) 3.5 - 5.2 (g/dL)    AST 33  0 - 37 (U/L)    ALT 30  0 - 53 (U/L)    Alkaline Phosphatase 81  39 - 117 (U/L)    Total Bilirubin 0.5  0.3 - 1.2 (mg/dL)    GFR calc non Af Amer 89 (*) >90 (mL/min)    GFR calc Af Amer >90  >90 (mL/min)     DIFFERENTIAL     Status: Normal   Collection Time   04/07/11  5:45 AM      Component Value Range Comment   Neutrophils Relative 58  43 - 77 (%)    Lymphocytes Relative 29  12 - 46 (%)    Monocytes Relative 9  3 - 12 (%)    Eosinophils Relative 3  0 - 5 (%)    Basophils Relative 1  0 - 1 (%)    Neutro Abs 3.9  1.7 - 7.7 (K/uL)    Lymphs Abs 1.9  0.7 - 4.0 (K/uL)    Monocytes Absolute 0.6  0.1 - 1.0 (K/uL)    Eosinophils Absolute 0.2  0.0 - 0.7 (K/uL)    Basophils Absolute 0.1  0.0 - 0.1 (K/uL)    RBC Morphology TARGET CELLS        HENT:  Head: Normocephalic.  Neck: Normal range of motion. No thyromegaly present.  Cardiovascular: Normal rate and regular rhythm.  Pulmonary/Chest: Breath sounds normal. He has no wheezes.  Abdominal: He exhibits no distension. There is no tenderness.  Musculoskeletal: He exhibits no edema.  Neurological: He is alert.  Follows basic two-step motor commands. Answers simple questions. There is a language barrier but patient can communicate his needs. Patient with 2-/5 strength intermittently  right biceps. He is 3- out of 5 right lower extremity except 0/5 ankle DF . Is a right central 7 and tongue deviation. Speech is slightly less dysarthric. Sensation is right arm and leg Mildly diminished. Reflexes are 1+ throughout.  Skin: Skin is warm and dry.  Psychiatric: He has a normal mood and affect  CN central 7 on R  Assessment/Plan: 1. Functional deficits secondary to L pontine infarct causing R hemiparesis which require 3+ hours per day of interdisciplinary therapy in a comprehensive inpatient rehab setting. Physiatrist is providing close team supervision and 24 hour management of active medical problems listed below. Physiatrist and rehab team continue to assess barriers to discharge/monitor patient progress toward functional and medical goals.   2. Anticoagulation/DVT prophylaxis with Pharmaceutical: Lovenox 3. Pain Management: Tylenol  Medical Problem  List and Plan:  1. left paracentral pontine infarction. Continue aspirin therapy  May need AFO 2. DVT Prophylaxis/Anticoagulation: Subcutaneous Lovenox. Following platelets.h/h stable 3. Dysphagia. Regular diet/ thin liquids. Tolerating well. Appetite improved 4. Hyperlipidemia. Crestor  5. Hypertension. Currently on no antihypertensive medications. Will monitor the increased activity  6. Coronary artery disease. CABG 06. Continue aspirin  7. Medical noncompliance. Provide counseling and discuss with family.   LOS (Days) 2 A FACE TO FACE EVALUATION WAS PERFORMED        

## 2011-04-19 NOTE — Progress Notes (Signed)
Occupational Therapy Session Note  Patient Details  Name: Edward Choi MRN: [id number] Date of Birth: [birth year]  Today's Date: 04/19/2011 Time: 1430-1500 Time Calculation (min): 30 min  Precautions: Precautions Precautions: Fall Precaution Comments: requal diet with thin liquids, no straw, right hemiplegia Required Braces or Orthoses: No Restrictions Weight Bearing Restrictions: No  Short Term Goals: OT Short Term Goal 1: Patient will bathe with supervision in shower in sit and stand OT Short Term Goal 1 - Progress: Met OT Short Term Goal 2: Patient will dress UB & LB with supervision in sit and stand OT Short Term Goal 2 - Progress: Partly met (patient has only had clothes for 2 days) OT Short Term Goal 3: Patient will perform toilet transfers and toileting with supervision OT Short Term Goal 3 - Progress: Met OT Short Term Goal 4: Patient will use RUE as a stabilizer during all ADL tasks with min cues OT Short Term Goal 4 - Progress: Met OT Short Term Goal 5: Pt will demonstrate self ROM exercises for RUE with minimal cues. OT Short Term Goal 5 - Progress: Met  Skilled Therapeutic Interventions/Progress Updates:    Worked on toileting using RW to ambulate into the bathroom.  Pt needs min instructional cues to position the RUE prior to transfers as well as pushing up from the chair using the LUE instead of grabbing the walker.  Able to manage clothing with close supervision sit to stand.  Transitioned to the kitchen and worked on standing task incorporating the RUE to wash the counter tops.  Pt requires max facilitation to initiate LUE shoulder and elbow movement.  Pt needs mod facilitation to decrease overcompensation with his trunk and pelvis.  Also educated pt on self ROM exercises for the RUE.  Vital Signs Therapy Vitals Temp: 98.6 F (37 C) Temp src: Oral Pulse Rate: 93  Resp: 18  BP: 125/81 mmHg Patient Position, if appropriate: Sitting Oxygen Therapy SpO2: 94  % O2 Device: None (Room air) Pain Pain Assessment Pain Assessment: No/denies pain ADL See FIM section  Exercises General Exercises - Upper Extremity Shoulder Flexion: Self ROM;Right;10 reps;Seated;Other (comment) (Mod demonstrational cueing for technique.) Elbow Flexion: Self ROM;Right;10 reps;Seated (Mod demonstrational cues for technique.) Digit Composite Flexion: AAROM;Right;10 reps;Seated  Other Treatments Treatments Neuromuscular Facilitation: Right;Lower Extremity;Forced use;Activity to increase motor control;Activity to increase timing and sequencing;Activity to increase sustained activation;Activity to increase lateral weight shifting;Activity to increase anterior-posterior weight shifting  Therapy/Group: Individual Therapy  Johaan Ryser OTR/L 04/19/2011, 4:01 PM

## 2011-04-20 NOTE — Patient Care Conference (Addendum)
Inpatient RehabilitationTeam Conference Note Date: 04/19/2011   Time: 10:30 AM    Patient Name: Edward Choi      Medical Record Number: [id number]  Date of Birth: 08-13-1950 Sex: Male         Room/Bed: 4140/4140-01 Payor Info: Payor: BLUE CROSS BLUE SHIELD  Plan: BCBS PPO OUT OF STATE  Product Type: *No Product type*     Admitting Diagnosis: LT CVA  Admit Date/Time:  04/06/2011  3:58 PM Admission Comments: No comment available   Primary Diagnosis:  Stroke Principal Problem: Stroke  Patient Active Problem List  Diagnoses Date Noted  . Stroke 04/07/2011  . CAD (coronary artery disease) 04/03/2011  . CVA (cerebrovascular accident) 04/03/2011  . Hypertension 04/03/2011  . Depression 04/03/2011  . Hx of CABG 04/03/2011  . Hyperlipidemia 04/03/2011    Expected Discharge Date: Expected Discharge Date: 04/27/11  Team Members Present: Physician: Dr. Claudette Laws Case Manager Present: Lutricia Horsfall, RN Social Worker Present: Dossie Der, LCSW Nurse Present: Gregor Hams, RN PT Present: [name], PT OT Present: Bretta Bang, OT;Other (comment) Perrin Maltese, OT) SLP Present: Fae Pippin, SLP Tora Duck, RN, PPS Coordinator     Current Status/Progress Goal Weekly Team Focus  Medical   HTN controlled off meds, wife concerned regarding Med compliance  Educate pt on med compliance prior to D/C  See above   Bowel/Bladder   Continent of bowel and bladder  Continent of bowel and bladder  Pt to remain continent with toileting assist   Swallow/Nutrition/ Hydration             ADL's   steady assist with ADL transfers, supervision with BADLs, increasing RUE active movement  mod I except for supervision with shower transfer  ADL retraining, RUE NM re-ed. balance activ, pt/family education   Mobility   supervision transfers and w/c mobility, mod A gait with use of Bioness, max A stairs  mod I overall except supervision car transfers and stairs   motor control  and stability of RLE, gait and stairs with Bioness for DF assist   Communication             Safety/Cognition/ Behavioral Observations            Pain   No c/o pain   <3  Continue to monitor   Skin   Skin CDI  CDI  Monitor      *See Interdisciplinary Assessment and Plan and progress notes for long and short-term goals  Barriers to Discharge: Physical mobility status    Possible Resolutions to Barriers:  Cont program    Discharge Planning/Teaching Needs:  Home with wife and Mother in-law who can provide supervision level      Team Discussion:  BP regulated. Adjusted bowel meds. Using Bioness. Will need AFO to stabelize knee. Severe R side deficits/neglect. Pt's wife has voiced concerns that pt will not be compliant with meds after d/c. Nursing providing education on meds and stroke prevention.  Revisions to Treatment Plan:  none   Continued Need for Acute Rehabilitation Level of Care: The patient requires daily medical management by a physician with specialized training in physical medicine and rehabilitation for the following conditions: Daily direction of a multidisciplinary physical rehabilitation program to ensure safe treatment while eliciting the highest outcome that is of practical value to the patient.: Yes Daily medical management of patient stability for increased activity during participation in an intensive rehabilitation regime.: Yes Daily analysis of laboratory values and/or radiology reports with any subsequent need  for medication adjustment of medical intervention for : Neurological problems  Met with pt to report on team conference. Pt in agreement with plan for d/c on 04/27/11. SW to call pt's wife to schedule  Family education. Meryl Dare 04/20/2011, 10:11 AM

## 2011-04-20 NOTE — Progress Notes (Signed)
Occupational Therapy Session Note  Patient Details  Name: Edward Choi MRN: [id number] Date of Birth: 05-16-1950  Today's Date: 04/20/2011 Time: 1300-1319 Time Calculation (min): 19 min  Precautions: Precautions Precautions: Fall Precaution Comments: requal diet with thin liquids, no straw, right hemiplegia Required Braces or Orthoses: No Restrictions Weight Bearing Restrictions: No  Short Term Goals: OT Short Term Goal 1: Patient will bathe with supervision in shower in sit and stand OT Short Term Goal 1 - Progress: Met OT Short Term Goal 2: Patient will dress UB & LB with supervision in sit and stand OT Short Term Goal 2 - Progress: Partly met (patient has only had clothes for 2 days) OT Short Term Goal 3: Patient will perform toilet transfers and toileting with supervision OT Short Term Goal 3 - Progress: Met OT Short Term Goal 4: Patient will use RUE as a stabilizer during all ADL tasks with min cues OT Short Term Goal 4 - Progress: Met OT Short Term Goal 5: Pt will demonstrate self ROM exercises for RUE with minimal cues. OT Short Term Goal 5 - Progress: Met  Skilled Therapeutic Interventions/Progress Updates:    Worked on neuromuscular re ed in quadriped to focus on RUE weightbearing and support while washing the mat with his left hand.  Pt required mod facilitation at the right elbow and shoulder to keep from collapsing.  Transitioned to supine using dowel rod.  Taped his RUE to dowel rod and worked on bilateral shoulder flexion and place and hold at 90 degrees while attempting to maintain elbow extension.  Pt unable to maintain extension unless given mod assist.  Pt also noted to be able to elicit active elbow extension if therapist holds shoulder at 90 degrees and has pt focus on straightening his arm.  Able to achieve approximately 80% of full extension against gravity.  Recommend continued neuromuscular re-education for the RUE.   Pain Pain Assessment Pain Assessment:  No/denies pain ADL See FIM Section   Therapy/Group: Individual Therapy  Malayla Granberry OTR/L 04/20/2011, 1:39 PM

## 2011-04-20 NOTE — Progress Notes (Signed)
Occupational Therapy Session Note  Patient Details  Name: Edward Choi MRN: [id number] Date of Birth: 1950-07-05  Today's Date: 04/20/2011 Time: 0800-0855 Time Calculation (min): 55 min  Precautions: Precautions Precautions: Fall Precaution Comments: requal diet with thin liquids, no straw, right hemiplegia Required Braces or Orthoses: No Restrictions Weight Bearing Restrictions: No  Short Term Goals: OT Short Term Goal 1: Patient will bathe with supervision in shower in sit and stand OT Short Term Goal 1 - Progress: Met OT Short Term Goal 2: Patient will dress UB & LB with supervision in sit and stand OT Short Term Goal 2 - Progress: Partly met (patient has only had clothes for 2 days) OT Short Term Goal 3: Patient will perform toilet transfers and toileting with supervision OT Short Term Goal 3 - Progress: Met OT Short Term Goal 4: Patient will use RUE as a stabilizer during all ADL tasks with min cues OT Short Term Goal 4 - Progress: Met OT Short Term Goal 5: Pt will demonstrate self ROM exercises for RUE with minimal cues. OT Short Term Goal 5 - Progress: Met  Skilled Therapeutic Interventions/Progress Updates:    Pt seen for ADL retraining.  Focus on ambulation to walk-in shower with hand held assist, bathing with increased independence, weightbearing through BLE in standing, and use of RUE with hand over hand assist to open containers.  Pt min assist ambulation and transfer with Lt hand held assist with cues to clear floor with LLE.  Pt supervision with bathing in sit to stand position.  Grooming conducted in standing at sink with verbal and tactile cues to bear weight through RLE.  NM re-ed with focus on sanding motion with RUE with support at elbow to decrease gravity and BUE task to increase initiation in functional movements. Pt with no c/o pain this session.  Therapy/Group: Individual Therapy  Leonette Monarch 04/20/2011, 9:04 AM

## 2011-04-20 NOTE — Progress Notes (Signed)
Patient ID: Edward Choi, male   DOB: [birth year], 61 y.o.   MRN: [id number] Limited English but indicates no complaints. Able to move R UE more.Encouraged with progress Subjective/Complaints: Having bowel movement everyday per pt. Review of Systems  Respiratory: Negative.   Cardiovascular: Negative.   Gastrointestinal: Negative for constipation.  Neurological: Positive for focal weakness.   Objective: Vital Signs: Blood pressure 114/78, pulse 99, temperature 98 F (36.7 C), temperature source Oral, resp. rate 16, SpO2 93.00%. No results found. Results for orders placed during the hospital encounter of 04/06/11 (from the past 72 hour(s))  MRSA PCR SCREENING     Status: Normal   Collection Time   04/06/11  5:11 PM      Component Value Range Comment   MRSA by PCR NEGATIVE  NEGATIVE    CBC     Status: Abnormal   Collection Time   04/07/11  5:45 AM      Component Value Range Comment   WBC 6.7  4.0 - 10.5 (K/uL)    RBC 6.77 (*) 4.22 - 5.81 (MIL/uL)    Hemoglobin 13.4  13.0 - 17.0 (g/dL)    HCT 14.7  82.9 - 56.2 (%)    MCV 58.8 (*) 78.0 - 100.0 (fL)    MCH 19.8 (*) 26.0 - 34.0 (pg)    MCHC 33.7  30.0 - 36.0 (g/dL)    RDW 13.0 (*) 86.5 - 15.5 (%)    Platelets 232  150 - 400 (K/uL)   COMPREHENSIVE METABOLIC PANEL     Status: Abnormal   Collection Time   04/07/11  5:45 AM      Component Value Range Comment   Sodium 139  135 - 145 (mEq/L)    Potassium 4.2  3.5 - 5.1 (mEq/L)    Chloride 105  96 - 112 (mEq/L)    CO2 23  19 - 32 (mEq/L)    Glucose, Bld 108 (*) 70 - 99 (mg/dL)    BUN 12  6 - 23 (mg/dL)    Creatinine, Ser 7.84  0.50 - 1.35 (mg/dL)    Calcium 8.8  8.4 - 10.5 (mg/dL)    Total Protein 7.4  6.0 - 8.3 (g/dL)    Albumin 3.3 (*) 3.5 - 5.2 (g/dL)    AST 33  0 - 37 (U/L)    ALT 30  0 - 53 (U/L)    Alkaline Phosphatase 81  39 - 117 (U/L)    Total Bilirubin 0.5  0.3 - 1.2 (mg/dL)    GFR calc non Af Amer 89 (*) >90 (mL/min)    GFR calc Af Amer >90  >90 (mL/min)     DIFFERENTIAL     Status: Normal   Collection Time   04/07/11  5:45 AM      Component Value Range Comment   Neutrophils Relative 58  43 - 77 (%)    Lymphocytes Relative 29  12 - 46 (%)    Monocytes Relative 9  3 - 12 (%)    Eosinophils Relative 3  0 - 5 (%)    Basophils Relative 1  0 - 1 (%)    Neutro Abs 3.9  1.7 - 7.7 (K/uL)    Lymphs Abs 1.9  0.7 - 4.0 (K/uL)    Monocytes Absolute 0.6  0.1 - 1.0 (K/uL)    Eosinophils Absolute 0.2  0.0 - 0.7 (K/uL)    Basophils Absolute 0.1  0.0 - 0.1 (K/uL)    RBC Morphology TARGET CELLS  HENT:  Head: Normocephalic.  Neck: Normal range of motion. No thyromegaly present.  Cardiovascular: Normal rate and regular rhythm.  Pulmonary/Chest: Breath sounds normal. He has no wheezes.  Abdominal: He exhibits no distension. There is no tenderness.  Musculoskeletal: He exhibits no edema.  Neurological: He is alert.  Follows basic two-step motor commands. Answers simple questions. There is a language barrier but patient can communicate his needs. Patient with 2-/5 strength intermittently  right biceps. He is 3- out of 5 right lower extremity except 0/5 ankle DF . Is a right central 7 and tongue deviation. Speech is slightly less dysarthric. Sensation is right arm and leg Mildly diminished. Reflexes are 1+ throughout.  Skin: Skin is warm and dry.  Psychiatric: He has a normal mood and affect  CN central 7 on R  Assessment/Plan: 1. Functional deficits secondary to L pontine infarct causing R hemiparesis which require 3+ hours per day of interdisciplinary therapy in a comprehensive inpatient rehab setting. Physiatrist is providing close team supervision and 24 hour management of active medical problems listed below. Physiatrist and rehab team continue to assess barriers to discharge/monitor patient progress toward functional and medical goals.   2. Anticoagulation/DVT prophylaxis with Pharmaceutical: Lovenox 3. Pain Management: Tylenol  Medical Problem  List and Plan:  1. left paracentral pontine infarction. Continue aspirin therapy  May need AFO 2. DVT Prophylaxis/Anticoagulation: Subcutaneous Lovenox. Following platelets.h/h stable 3. Dysphagia. Regular diet/ thin liquids. Tolerating well. Appetite improved 4. Hyperlipidemia. Crestor  5. Hypertension. Currently on no antihypertensive medications. Will monitor the increased activity  6. Coronary artery disease. CABG 06. Continue aspirin  7. Medical noncompliance. Provide counseling and discuss with family.   LOS (Days) 2 A FACE TO FACE EVALUATION WAS PERFORMED

## 2011-04-20 NOTE — Progress Notes (Signed)
Physical Therapy Weekly Progress Note  Patient Details  Name: Edward Choi MRN: [id number] Date of Birth: September 07, 1950  Today's Date: 04/20/2011 Time: 1003-1056  Time Calculation (min): 53 min  Patient continues to make steady progress toward PT LTG and STG.  Patient has met 5 of 5 short term goals and 1 of 9 LTG.  Patient is currently supervision for bed <> chair transfers stand pivot, w/c mobility on unit, min A for bed mobility from flat bed, mod A for gait training with LUE support and use of Bioness estim for R ankle DF training and max A for stair negotiation with AD and no rail.  Secondary to limited return of R ankle DF muscle activation patient has been evaluated for R ankle AFO for home and community mobility to decrease falls risk and improve ankle and knee stability.    Patient D/C date set for 04/27/11 and continues to demonstrate the following deficits: RUE and LE hemiplegia with decreased trunk/core strength and postural control, impaired dynamic standing balance and balance strategies, impaired gait and increased risk for falls and therefore will continue to benefit from skilled PT intervention to enhance overall performance with balance, postural control, ability to compensate for deficits, functional use of  right upper extremity and right lower extremity, coordination and functional transfers and gait.  Patient progressing toward long term goals..  Continue plan of care.  PT Short Term Goals PT Short Term Goal 1: Patient will perform bed mobility flat bed to L and R with consistent min A with minimal verbal and tactile cues. PT Short Term Goal 1 - Progress: Met PT Short Term Goal 2: Patient will perform bed  <> chair transfers to L and R with consistent min A with intermittent verbal and tactile cues. PT Short Term Goal 2 - Progress: Met PT Short Term Goal 3: Patient will perform w/c mobility on unit with L hemi technique with min A PT Short Term Goal 3 - Progress: Met PT  Short Term Goal 4: Patient will perform gait training x 100' with LRAD and mod A. PT Short Term Goal 4 - Progress: Met PT Short Term Goal 5: Patient will perform up and down 3 stairs with one rail and mod A for home entry/exit. PT Short Term Goal 5 - Progress: Met  Therapy Documentation Precautions: Precautions Precautions: Fall Precaution Comments: requal diet with thin liquids, no straw, right hemiplegia Required Braces or Orthoses: No Restrictions Weight Bearing Restrictions: No   Pain Pain Assessment Pain Assessment: No/denies pain Locomotion  Ambulation Ambulation: Yes Ambulation/Gait Assistance: 2: Max assist Ambulation Distance (Feet): 25 Feet Assistive device: 1 person hand held assist Ambulation/Gait Assistance Details (indicate cue type and reason): P&O present to observe patient's gait and assess for appropriate R AFO; no facilitation given during gait: patient continues to present with R foot drop, genu recurvatum, hip ER and use of trunk flexion and extension for LE advancement and extension.  Determined that patient would benefit from custom molded solid ankle AFO for increased foot clearance and knee control/stability in stance.  Patient casted for AFO and will receive tomorrow.   Stairs / Additional Locomotion Stairs: Yes Stairs Assistance: 2: Max Estate agent Assistance Details: Verbal cues for sequencing;Tactile cues for weight shifting;Tactile cues for sequencing;Tactile cues for placement Stairs Assistance Details (indicate cue type and reason): Performed stairs with minimal facilitation to allow P&O to observe patient's movement patterns: required max A for stability during LLE advancement, RLE advancement and placement; patient still presents with increased  trunk lateral flexion and anterior and posterior lean to advance and extend RLE Stair Management Technique: No rails;Step to pattern;Forwards;Other (comment) (L HHA) Number of Stairs: 2  Height of Stairs: 6    Wheelchair Mobility Distance: 150   Other Treatments Treatments Therapeutic Activity: W/c parts management training to allow patient to manage indepenently for mod I transfers; patient gave repeat demonstration in room when transferring w/c > recliner supervision; had discussion with patient about equipment for D/C; patient feels he will not need a w/c and will be limiting his ambulation to household only and will not go out in the community.  Bed mobility training on flat ADL bed with focus on more normalized trunk and pelvis activation for rolling L and R and for side to sit while minimizing use of head/neck and extension to initiate movement.  Able to complete with supervision at end of session.  Therapy/Group: Individual Therapy  [name] [facility] 04/20/2011, 12:38 PM

## 2011-04-20 NOTE — Progress Notes (Signed)
Patient ID: Edward Choi, male   DOB: [birth year], 61 y.o.   MRN: [id number] Called pt's CM at [facility] with update on team conference.

## 2011-04-21 DIAGNOSIS — Z5189 Encounter for other specified aftercare: Secondary | ICD-10-CM

## 2011-04-21 DIAGNOSIS — G8100 Flaccid hemiplegia affecting unspecified side: Secondary | ICD-10-CM

## 2011-04-21 DIAGNOSIS — I6330 Cerebral infarction due to thrombosis of unspecified cerebral artery: Secondary | ICD-10-CM

## 2011-04-21 NOTE — Progress Notes (Signed)
Occupational Therapy Session Note  Patient Details  Name: Ison Wichmann MRN: [id number] Date of Birth: 1951/01/03  Today's Date: 04/21/2011 Time: 1000-1100 Time Calculation (min): 60 min  Precautions: Precautions Precautions: Fall Precaution Comments: requal diet with thin liquids, no straw, right hemiplegia Required Braces or Orthoses: No Restrictions Weight Bearing Restrictions: No   Skilled Therapeutic Interventions/Progress Updates:    ADL retraining including bathing at shower level and dressing with sit to stand from chair.  Pt amb to BR with hand-held assist with verbal cues to slow down pace.  Pt completed bathing activities with min A to bathe LUE.  Pt did not use bath mit.  Pt used grab bars to steady self while standing to bathe buttocks.  Pt completed dressing with supervision.  Pt engaged in neuro reed in gym including weight bearing on RUE and facilitated movement with emphasis on isolated movement.     Pain Pain Assessment Pain Assessment: No/denies pain     Therapy/Group: Individual Therapy  Rich Brave 04/21/2011, 12:23 PM

## 2011-04-21 NOTE — Progress Notes (Signed)
Note reviewed and accurately reflects treatment session.   

## 2011-04-21 NOTE — Progress Notes (Signed)
Patient ID: Edward Choi, male   DOB: 1950/11/14, 61 y.o.   MRN: [id number] : [id number] Limited English but indicates no complaints. Able to move R UE more.Encouraged with progress. Low grade temp noted by RN today.Pt denies feeling bad. Subjective/Complaints: Having bowel movement everyday per pt. Review of Systems  Respiratory: Negative.   Cardiovascular: Negative.   Gastrointestinal: Negative for constipation.  Neurological: Positive for focal weakness.   Objective: Vital Signs: Blood pressure 114/78, pulse 99, temperature 98 F (36.7 C), temperature source Oral, resp. rate 16, SpO2 93.00%. No results found. Results for orders placed during the hospital encounter of 04/06/11 (from the past 72 hour(s))  MRSA PCR SCREENING     Status: Normal   Collection Time   04/06/11  5:11 PM      Component Value Range Comment   MRSA by PCR NEGATIVE  NEGATIVE    CBC     Status: Abnormal   Collection Time   04/07/11  5:45 AM      Component Value Range Comment   WBC 6.7  4.0 - 10.5 (K/uL)    RBC 6.77 (*) 4.22 - 5.81 (MIL/uL)    Hemoglobin 13.4  13.0 - 17.0 (g/dL)    HCT 78.2  95.6 - 21.3 (%)    MCV 58.8 (*) 78.0 - 100.0 (fL)    MCH 19.8 (*) 26.0 - 34.0 (pg)    MCHC 33.7  30.0 - 36.0 (g/dL)    RDW 08.6 (*) 57.8 - 15.5 (%)    Platelets 232  150 - 400 (K/uL)   COMPREHENSIVE METABOLIC PANEL     Status: Abnormal   Collection Time   04/07/11  5:45 AM      Component Value Range Comment   Sodium 139  135 - 145 (mEq/L)    Potassium 4.2  3.5 - 5.1 (mEq/L)    Chloride 105  96 - 112 (mEq/L)    CO2 23  19 - 32 (mEq/L)    Glucose, Bld 108 (*) 70 - 99 (mg/dL)    BUN 12  6 - 23 (mg/dL)    Creatinine, Ser 4.69  0.50 - 1.35 (mg/dL)    Calcium 8.8  8.4 - 10.5 (mg/dL)    Total Protein 7.4  6.0 - 8.3 (g/dL)    Albumin 3.3 (*) 3.5 - 5.2 (g/dL)    AST 33  0 - 37 (U/L)    ALT 30  0 - 53 (U/L)    Alkaline Phosphatase 81  39 - 117 (U/L)    Total Bilirubin 0.5  0.3 - 1.2 (mg/dL)    GFR calc non Af Amer 89  (*) >90 (mL/min)    GFR calc Af Amer >90  >90 (mL/min)   DIFFERENTIAL     Status: Normal   Collection Time   04/07/11  5:45 AM      Component Value Range Comment   Neutrophils Relative 58  43 - 77 (%)    Lymphocytes Relative 29  12 - 46 (%)    Monocytes Relative 9  3 - 12 (%)    Eosinophils Relative 3  0 - 5 (%)    Basophils Relative 1  0 - 1 (%)    Neutro Abs 3.9  1.7 - 7.7 (K/uL)    Lymphs Abs 1.9  0.7 - 4.0 (K/uL)    Monocytes Absolute 0.6  0.1 - 1.0 (K/uL)    Eosinophils Absolute 0.2  0.0 - 0.7 (K/uL)    Basophils Absolute 0.1  0.0 - 0.1 (K/uL)  RBC Morphology TARGET CELLS      HENT:  Head: Normocephalic.  Neck: Normal range of motion. No thyromegaly present.  Cardiovascular: Normal rate and regular rhythm.  Pulmonary/Chest: Breath sounds normal. He has no wheezes.  Abdominal: He exhibits no distension. There is no tenderness.  Musculoskeletal: He exhibits no edema.  Neurological: He is alert.  Follows basic two-step motor commands. Answers simple questions. There is a language barrier but patient can communicate his needs. Patient with 2-/5 strength intermittently  right biceps and deltoid. He is 3- out of 5 right lower extremity except 0/5 ankle DF . Is a right central 7 and tongue deviation. Speech is slightly less dysarthric. Sensation is right arm and leg ok. Reflexes are 1+ throughout.  Skin: Skin is warm and dry.  Psychiatric: He has a normal mood and affect  CN central 7 on R  Assessment/Plan: 1. Functional deficits secondary to L pontine infarct causing R hemiparesis which require 3+ hours per day of interdisciplinary therapy in a comprehensive inpatient rehab setting. Physiatrist is providing close team supervision and 24 hour management of active medical problems listed below. Physiatrist and rehab team continue to assess barriers to discharge/monitor patient progress toward functional and medical goals.   2. Anticoagulation/DVT prophylaxis with Pharmaceutical:  Lovenox 3. Pain Management: Tylenol  Medical Problem List and Plan:  1. left paracentral pontine infarction. Continue aspirin therapy  May need AFO 2. DVT Prophylaxis/Anticoagulation: Subcutaneous Lovenox. Following platelets.h/h stable 3. Dysphagia. Regular diet/ thin liquids. Tolerating well. Appetite improved 4. Hyperlipidemia. Crestor  5. Hypertension. Currently on no antihypertensive medications. Will monitor the increased activity  6. Coronary artery disease. CABG 06. Continue aspirin  7. Medical noncompliance. Provide counseling and discuss with family. 8. Low grade 99.5 temp x 1 will monitor no symptoms.  LOS (Days) 2 A FACE TO FACE EVALUATION WAS PERFORMED

## 2011-04-21 NOTE — Progress Notes (Signed)
Occupational Therapy Session Note  Patient Details  Name: Edward Choi MRN: [id number] Date of Birth: September 09, 1950  Today's Date: 04/21/2011 Time: 1130-1200 Time Calculation (min): 30 min  Precautions: Precautions Precautions: Fall Precaution Comments: requal diet with thin liquids, no straw, right hemiplegia Required Braces or Orthoses: No Restrictions Weight Bearing Restrictions: No  Short Term Goals: OT Short Term Goal 1: Patient will bathe with supervision in shower in sit and stand OT Short Term Goal 1 - Progress: Met OT Short Term Goal 2: Patient will dress UB & LB with supervision in sit and stand OT Short Term Goal 2 - Progress: Partly met (patient has only had clothes for 2 days) OT Short Term Goal 3: Patient will perform toilet transfers and toileting with supervision OT Short Term Goal 3 - Progress: Met OT Short Term Goal 4: Patient will use RUE as a stabilizer during all ADL tasks with min cues OT Short Term Goal 4 - Progress: Met OT Short Term Goal 5: Pt will demonstrate self ROM exercises for RUE with minimal cues. OT Short Term Goal 5 - Progress: Met  Skilled Therapeutic Interventions/Progress Updates:  Pt seen in gym after ambulating part way and rolling himself in WC rest of way. Pt transferred to [facility] and engaged in RUE weightbearing activities and facilitated movement of RUE. Pt exhibits decreased ability to isolate movements.   Pain None stated  Therapy/Group: Individual Therapy  Loleta Chance, OTAS 04/21/2011, 12:07 PM

## 2011-04-21 NOTE — Progress Notes (Signed)
Social Work Patient ID: Edward Choi, male   DOB: 1951-01-18, 62 y.o.   MRN: [id number]  Met with pt to discuss his progress and to see when wife is coming back in.  He reports this weekend She has been working a lot during the week, extra hours.  Aware of discharge 2/14 and will come in next week To go through therapies with pt.  Have left a message for wife, will await return call.

## 2011-04-21 NOTE — Progress Notes (Signed)
Physical Therapy Session Note  Patient Details  Name: Edward Choi MRN: [id number] Date of Birth: [birth year]  Today's Date: 04/21/2011 Time:  -     Precautions: Precautions Precautions: Fall Precaution Comments: requal diet with thin liquids, no straw, right hemiplegia Required Braces or Orthoses: No Restrictions Weight Bearing Restrictions: No  Short Term Goals: PT Short Term Goal 1: Patient will perform bed mobility flat bed to L and R with consistent min A with minimal verbal and tactile cues. PT Short Term Goal 1 - Progress: Met PT Short Term Goal 2: Patient will perform bed  <> chair transfers to L and R with consistent min A with intermittent verbal and tactile cues. PT Short Term Goal 2 - Progress: Met PT Short Term Goal 3: Patient will perform w/c mobility on unit with L hemi technique with min A PT Short Term Goal 3 - Progress: Met PT Short Term Goal 4: Patient will perform gait training x 100' with LRAD and mod A. PT Short Term Goal 4 - Progress: Met PT Short Term Goal 5: Patient will perform up and down 3 stairs with one rail and mod A for home entry/exit. PT Short Term Goal 5 - Progress: Met  Skilled Therapeutic Interventions/Progress Updates:  AM treatment focused on neuromuscular re-ed for RLE, trunk via manual contacts, visual and verbal cues, mobility training.  W/C mobility using hemi method with RUE,RLE, x 160'x 2 with supervision.  W/c prep for transfer with supervision, one cue consistently for R brake.  Bed mobility with supervision for all; supine> sit with abnormal movement patterns; VCs to improve.  Therapeutic exercise in supine : bil bridging, bil abduction/adduction; RLE mass extension against minimal resistance from mass flexed position.  Gait training   with RW x 87', x 75' with mod a for RLE hip and knee control, with ACE on RLE due to foot drop.  Gait deviations continue with narrow BOS, lack of RLE hip IE, early R knee extension with wt bearing.     PM treatment focused on neuromuscular re-education, gait training, transfer training, orthotic fitting and training.  Neuromuscular re-education for RLE hip adduction and IR via manual contacts, visual and verbal cues, and functional activities such as R foot taps onto target, kicking ball with inside of R foot, bring R foot up onto step next to L foot.  Pt is able to consistently activate RLE hip IR and adductors while kicking a ball and placing RLE onto a step, but has limited carry-over during gait.  Gait ascending and descending 6 steps with R rail ascending, with min A; pt ascends sideways using L hand on rail, to simulate set-up at his home.  Chris from [employer] delivered custom solid ankle R AFO.  Gait training x 40' with RW, R hand orthosis, wearing R AFO, with min A for R foot placement, upright trunk and forward gaze.  Custom AFO is effective for controlling R foot drop and decreases R knee extensor thrust; skin check negative after wearing 20 minutes.  PT discussed with pt use of AFO during gait for now, and to watch for red areas after he wears it.  Pt understands that he will remove insole of R shoe when using AFO.   Pain Pain Assessment Pain Assessment: No/denies pain Pain Score: 0-No pain, AM and PM treatments Mobility Bed Mobility Bed Mobility: Yes Rolling Right: 5: Supervision (no rail, on mat) Rolling Left: 5: Supervision (without rail, on mat) Right Sidelying to Sit: 5: Supervision;HOB  flat (on mat) Supine to Sit: 5: Supervision (no rail, on mat)       Therapy/Group: Individual Therapy  Gianah Batt 04/21/2011, 8:58 AM

## 2011-04-22 NOTE — Progress Notes (Signed)
Patient ID: Edward Choi, male   DOB: 28-Feb-1951, 61 y.o.   MRN: [id number] Limited English but indicates no complaints. Able to move R UE more.Encouraged with progress. Low grade temp noted by on 2/8.Pt denies feeling bad. Subjective/Complaints: Having bowel movement everyday per pt. Review of Systems  Respiratory: Negative.   Cardiovascular: Negative.   Gastrointestinal: Negative for constipation.  Neurological: Positive for focal weakness.   Objective: Vital Signs: Blood pressure 114/78, pulse 99, temperature 98 F (36.7 C), temperature source Oral, resp. rate 16, SpO2 93.00%. No results found. Results for orders placed during the hospital encounter of 04/06/11 (from the past 72 hour(s))  MRSA PCR SCREENING     Status: Normal   Collection Time   04/06/11  5:11 PM      Component Value Range Comment   MRSA by PCR NEGATIVE  NEGATIVE    CBC     Status: Abnormal   Collection Time   04/07/11  5:45 AM      Component Value Range Comment   WBC 6.7  4.0 - 10.5 (K/uL)    RBC 6.77 (*) 4.22 - 5.81 (MIL/uL)    Hemoglobin 13.4  13.0 - 17.0 (g/dL)    HCT 40.9  81.1 - 91.4 (%)    MCV 58.8 (*) 78.0 - 100.0 (fL)    MCH 19.8 (*) 26.0 - 34.0 (pg)    MCHC 33.7  30.0 - 36.0 (g/dL)    RDW 78.2 (*) 95.6 - 15.5 (%)    Platelets 232  150 - 400 (K/uL)   COMPREHENSIVE METABOLIC PANEL     Status: Abnormal   Collection Time   04/07/11  5:45 AM      Component Value Range Comment   Sodium 139  135 - 145 (mEq/L)    Potassium 4.2  3.5 - 5.1 (mEq/L)    Chloride 105  96 - 112 (mEq/L)    CO2 23  19 - 32 (mEq/L)    Glucose, Bld 108 (*) 70 - 99 (mg/dL)    BUN 12  6 - 23 (mg/dL)    Creatinine, Ser 2.13  0.50 - 1.35 (mg/dL)    Calcium 8.8  8.4 - 10.5 (mg/dL)    Total Protein 7.4  6.0 - 8.3 (g/dL)    Albumin 3.3 (*) 3.5 - 5.2 (g/dL)    AST 33  0 - 37 (U/L)    ALT 30  0 - 53 (U/L)    Alkaline Phosphatase 81  39 - 117 (U/L)    Total Bilirubin 0.5  0.3 - 1.2 (mg/dL)    GFR calc non Af Amer 89 (*) >90  (mL/min)    GFR calc Af Amer >90  >90 (mL/min)   DIFFERENTIAL     Status: Normal   Collection Time   04/07/11  5:45 AM      Component Value Range Comment   Neutrophils Relative 58  43 - 77 (%)    Lymphocytes Relative 29  12 - 46 (%)    Monocytes Relative 9  3 - 12 (%)    Eosinophils Relative 3  0 - 5 (%)    Basophils Relative 1  0 - 1 (%)    Neutro Abs 3.9  1.7 - 7.7 (K/uL)    Lymphs Abs 1.9  0.7 - 4.0 (K/uL)    Monocytes Absolute 0.6  0.1 - 1.0 (K/uL)    Eosinophils Absolute 0.2  0.0 - 0.7 (K/uL)    Basophils Absolute 0.1  0.0 - 0.1 (K/uL)  RBC Morphology TARGET CELLS      HENT:  Head: Normocephalic.  Neck: Normal range of motion. No thyromegaly present.  Cardiovascular: Normal rate and regular rhythm.  Pulmonary/Chest: Breath sounds normal. He has no wheezes.  Abdominal: He exhibits no distension. There is no tenderness.  Musculoskeletal: He exhibits no edema.  Neurological: He is alert.  Follows basic two-step motor commands. Answers simple questions. There is a language barrier but patient can communicate his needs. Patient with 2-/5 strength intermittently  right biceps and deltoid. He is 3- out of 5 right lower extremity except 0/5 ankle DF . Is a right central 7 and tongue deviation. Speech is slightly less dysarthric. Sensation is right arm and leg ok. Reflexes are 1+ throughout.  Skin: Skin is warm and dry.  Psychiatric: He has a normal mood and affect  CN central 7 on R  Assessment/Plan: 1. Functional deficits secondary to L pontine infarct causing R hemiparesis which require 3+ hours per day of interdisciplinary therapy in a comprehensive inpatient rehab setting. Physiatrist is providing close team supervision and 24 hour management of active medical problems listed below. Physiatrist and rehab team continue to assess barriers to discharge/monitor patient progress toward functional and medical goals.   2. Anticoagulation/DVT prophylaxis with Pharmaceutical: Lovenox 3.  Pain Management: Tylenol  Medical Problem List and Plan:  1. left paracentral pontine infarction. Continue aspirin therapy  May need AFO 2. DVT Prophylaxis/Anticoagulation: Subcutaneous Lovenox. Following platelets.h/h stable 3. Dysphagia. Regular diet/ thin liquids. Tolerating well. Appetite improved 4. Hyperlipidemia. Crestor  5. Hypertension. Currently on no antihypertensive medications. Will monitor the increased activity  6. Coronary artery disease. CABG 06. Continue aspirin  7. Medical noncompliance. Provide counseling and discuss with family. 8. Low grade 99.5 temp x 1 will monitor no symptoms.  LOS (Days) 2 A FACE TO FACE EVALUATION WAS PERFORMED

## 2011-04-22 NOTE — Progress Notes (Signed)
Occupational Therapy Note  Patient Details  Name: Edward Choi MRN: [id number] Date of Birth: 01-08-1951 Today's Date: 04/22/2011 Time:   1430-1515  ( 45 min) Pain:  None Individual Treatment Pt. Engaged in neuromuscular re education of RUE in therapeutic activities involving closed chain, AAROM, standing, and functional mobility.  Pt. Demonstrated good awareness on RUE and was minimal assist with weight bearing during functional tasks.  Did sit to stand with RUE on stool and pt demonstrated improved weight bearing.     [name] 04/22/2011, 6:42 PM

## 2011-04-23 LAB — CBC
MCH: 19.5 pg — ABNORMAL LOW (ref 26.0–34.0)
MCHC: 32.2 g/dL (ref 30.0–36.0)
MCV: 60.5 fL — ABNORMAL LOW (ref 78.0–100.0)
Platelets: 233 10*3/uL (ref 150–400)
RDW: 18.6 % — ABNORMAL HIGH (ref 11.5–15.5)

## 2011-04-23 LAB — DIFFERENTIAL
Basophils Relative: 1 % (ref 0–1)
Eosinophils Absolute: 0.1 10*3/uL (ref 0.0–0.7)
Eosinophils Relative: 2 % (ref 0–5)
Lymphs Abs: 1.6 10*3/uL (ref 0.7–4.0)
Monocytes Absolute: 0.6 10*3/uL (ref 0.1–1.0)
Monocytes Relative: 10 % (ref 3–12)
Neutrophils Relative %: 61 % (ref 43–77)

## 2011-04-23 NOTE — Progress Notes (Signed)
Occupational Therapy Session Note  Patient Details  Name: Edward Choi MRN: [id number] Date of Birth: 01-06-1951  Today's Date: 04/23/2011 Time: 1130-1215 Time Calculation (min): 45 min  Precautions: Precautions Precautions: Fall Precaution Comments: requal diet with thin liquids, no straw, right hemiplegia Required Braces or Orthoses: No Restrictions Weight Bearing Restrictions: No  Short Term Goals: OT Short Term Goal 1: Patient will bathe with supervision in shower in sit and stand OT Short Term Goal 1 - Progress: Met OT Short Term Goal 2: Patient will dress UB & LB with supervision in sit and stand OT Short Term Goal 2 - Progress: Partly met (patient has only had clothes for 2 days) OT Short Term Goal 3: Patient will perform toilet transfers and toileting with supervision OT Short Term Goal 3 - Progress: Met OT Short Term Goal 4: Patient will use RUE as a stabilizer during all ADL tasks with min cues OT Short Term Goal 4 - Progress: Met OT Short Term Goal 5: Pt will demonstrate self ROM exercises for RUE with minimal cues. OT Short Term Goal 5 - Progress: Met  Skilled Therapeutic Interventions/Progress Updates:    Engaged in bathing and dressing at shower level.  Pt ambulated to bathroom with RW and performed sit to stand with supervision using grab bar.  See FIM for functional levels.  Pt dressed self at recliner with set up.  Ambulated to CVA gym with RW and minimal assist.  Performed RUE neuromuscular re education with pt able to hold the cane for 3-4 minutes before dropping.     Pain:  none   ADL Grooming: Minimal assistance Where Assessed-Grooming: Standing at sink Upper Body Bathing: Minimal assistance Where Assessed-Upper Body Bathing: Shower Lower Body Bathing: Supervision/safety Where Assessed-Lower Body Bathing: Shower (sit and stand) Upper Body Dressing: set up Where Assessed-Upper Body Dressing: Chair Lower Body Dressing: supervision Where  Assessed-Lower Body Dressing: Chair (sit and stand) Toileting: Minimal assistance Where Assessed-Toileting: Teacher, adult education: Curator Method: Surveyor, minerals: Engineer, technical sales: Not assessed Film/video editor: Insurance underwriter Method: Warden/ranger: Transfer tub bench;Grab bars   Therapy/Group: Individual Therapy  [name] 04/23/2011, 12:37 PM

## 2011-04-23 NOTE — Progress Notes (Signed)
Patient ID: Edward Choi, male   DOB: [birth year], 61 y.o.   MRN: [id number] Limited English but indicates no complaints. Able to move R UE more.Encouraged with progress. Low grade temp noted by on 2/8.Pt denies feeling bad.  "One therapy today"  Going home 14th! Subjective/Complaints: Having bowel movement everyday per pt. Review of Systems  Respiratory: Negative.   Cardiovascular: Negative.   Gastrointestinal: Negative for constipation.  Neurological: Positive for focal weakness.   Objective: Vital Signs: Blood pressure 114/78, pulse 99, temperature 98 F (36.7 C), temperature source Oral, resp. rate 16, SpO2 93.00%. No results found. Results for orders placed during the hospital encounter of 04/06/11 (from the past 72 hour(s))  MRSA PCR SCREENING     Status: Normal   Collection Time   04/06/11  5:11 PM      Component Value Range Comment   MRSA by PCR NEGATIVE  NEGATIVE    CBC     Status: Abnormal   Collection Time   04/07/11  5:45 AM      Component Value Range Comment   WBC 6.7  4.0 - 10.5 (K/uL)    RBC 6.77 (*) 4.22 - 5.81 (MIL/uL)    Hemoglobin 13.4  13.0 - 17.0 (g/dL)    HCT 08.6  57.8 - 46.9 (%)    MCV 58.8 (*) 78.0 - 100.0 (fL)    MCH 19.8 (*) 26.0 - 34.0 (pg)    MCHC 33.7  30.0 - 36.0 (g/dL)    RDW 62.9 (*) 52.8 - 15.5 (%)    Platelets 232  150 - 400 (K/uL)   COMPREHENSIVE METABOLIC PANEL     Status: Abnormal   Collection Time   04/07/11  5:45 AM      Component Value Range Comment   Sodium 139  135 - 145 (mEq/L)    Potassium 4.2  3.5 - 5.1 (mEq/L)    Chloride 105  96 - 112 (mEq/L)    CO2 23  19 - 32 (mEq/L)    Glucose, Bld 108 (*) 70 - 99 (mg/dL)    BUN 12  6 - 23 (mg/dL)    Creatinine, Ser 4.13  0.50 - 1.35 (mg/dL)    Calcium 8.8  8.4 - 10.5 (mg/dL)    Total Protein 7.4  6.0 - 8.3 (g/dL)    Albumin 3.3 (*) 3.5 - 5.2 (g/dL)    AST 33  0 - 37 (U/L)    ALT 30  0 - 53 (U/L)    Alkaline Phosphatase 81  39 - 117 (U/L)    Total Bilirubin 0.5  0.3 - 1.2 (mg/dL)      GFR calc non Af Amer 89 (*) >90 (mL/min)    GFR calc Af Amer >90  >90 (mL/min)   DIFFERENTIAL     Status: Normal   Collection Time   04/07/11  5:45 AM      Component Value Range Comment   Neutrophils Relative 58  43 - 77 (%)    Lymphocytes Relative 29  12 - 46 (%)    Monocytes Relative 9  3 - 12 (%)    Eosinophils Relative 3  0 - 5 (%)    Basophils Relative 1  0 - 1 (%)    Neutro Abs 3.9  1.7 - 7.7 (K/uL)    Lymphs Abs 1.9  0.7 - 4.0 (K/uL)    Monocytes Absolute 0.6  0.1 - 1.0 (K/uL)    Eosinophils Absolute 0.2  0.0 - 0.7 (K/uL)  Basophils Absolute 0.1  0.0 - 0.1 (K/uL)    RBC Morphology TARGET CELLS      HENT:  Head: Normocephalic.  Neck: Normal range of motion. No thyromegaly present.  Cardiovascular: Normal rate and regular rhythm.  Pulmonary/Chest: Breath sounds normal. He has no wheezes.  Abdominal: He exhibits no distension. There is no tenderness.  Musculoskeletal: He exhibits no edema.  Neurological: He is alert.  Follows basic two-step motor commands. Answers simple questions. There is a language barrier but patient can communicate his needs. Patient with 2-/5 strength intermittently  right biceps and deltoid. He is 3- out of 5 right lower extremity except 0/5 ankle DF . Is a right central 7 and tongue deviation. Speech is slightly less dysarthric. Sensation is right arm and leg ok. Reflexes are 1+ throughout.  Skin: Skin is warm and dry.  Psychiatric: He has a normal mood and affect  CN central 7 on R  Assessment/Plan: 1. Functional deficits secondary to L pontine infarct causing R hemiparesis which require 3+ hours per day of interdisciplinary therapy in a comprehensive inpatient rehab setting. Physiatrist is providing close team supervision and 24 hour management of active medical problems listed below. Physiatrist and rehab team continue to assess barriers to discharge/monitor patient progress toward functional and medical goals.   2. Anticoagulation/DVT  prophylaxis with Pharmaceutical: Lovenox 3. Pain Management: Tylenol  Medical Problem List and Plan:  1. left paracentral pontine infarction. Continue aspirin therapy  May need AFO 2. DVT Prophylaxis/Anticoagulation: Subcutaneous Lovenox. Following platelets.h/h stable recheck CBC 3. Dysphagia. Regular diet/ thin liquids. Tolerating well. Appetite improved 4. Hyperlipidemia. Crestor  5. Hypertension. Currently on no antihypertensive medications. Will monitor the increased activity  6. Coronary artery disease. CABG 06. Continue aspirin  7. Medical noncompliance. Provide counseling and discuss with family. 8. Low grade 99.5 temp x 1 will monitor no symptoms.  LOS (Days) 2 A FACE TO FACE EVALUATION WAS PERFORMED

## 2011-04-24 NOTE — Progress Notes (Signed)
Social Work Patient ID: Edward Choi, male   DOB: September 10, 1950, 61 y.o.   MRN: [id number]  Message for wife to try to schedule family education for Thursday afternoon prior to pt's discharge. Pt aware trying to schedule education with wife.  He reports wife is working many hours before he comes Home.  Await wife's return call.  Team aware of need to do family education day of discharge.  When wife Has been here she participates in pt's care.

## 2011-04-24 NOTE — Progress Notes (Signed)
Occupational Therapy Session Note  Patient Details  Name: Faisal Stradling MRN: [id number] Date of Birth: June 08, 1950  Today's Date: 04/24/2011 Time: 0900-0958 Time Calculation (min): 58 min  Precautions: Precautions Precautions: Fall Precaution Comments: requal diet with thin liquids, no straw, right hemiplegia Required Braces or Orthoses: No Restrictions Weight Bearing Restrictions: No  Short Term Goals: OT Short Term Goal 1: Patient will bathe with supervision in shower in sit and stand OT Short Term Goal 1 - Progress: Met OT Short Term Goal 2: Patient will dress UB & LB with supervision in sit and stand OT Short Term Goal 2 - Progress: Partly met (patient has only had clothes for 2 days) OT Short Term Goal 3: Patient will perform toilet transfers and toileting with supervision OT Short Term Goal 3 - Progress: Met OT Short Term Goal 4: Patient will use RUE as a stabilizer during all ADL tasks with min cues OT Short Term Goal 4 - Progress: Met OT Short Term Goal 5: Pt will demonstrate self ROM exercises for RUE with minimal cues. OT Short Term Goal 5 - Progress: Met  Skilled Therapeutic Interventions/Progress Updates:    Engaged in NM re-ed with focus on BUE activity and RUE functional mobility.  Pt completed tasks with support at Rt elbow to decrease gravity with activity.  Activity in weightbearing on RUE while completing task with LUE to increase proprioception and weight shifting in BLE.  Encouraged use of BUE to open items on tray and use of RUE to move items closer.  Pain Pain Assessment Pain Score: 0-No pain  Therapy/Group: Individual Therapy  Leonette Monarch 04/24/2011, 12:19 PM

## 2011-04-24 NOTE — Progress Notes (Signed)
Patient ID: Edward Choi, male   DOB: December 15, 1950, 61 y.o.   MRN: [id number] Limited English but indicates no complaints. Able to move R UE more.Encouraged with progress. Low grade temp noted by on 2/8 now afebrile.Pt denies feeling bad.  Going home 14th! Subjective/Complaints: Having bowel movement everyday per pt. Review of Systems  Respiratory: Negative.   Cardiovascular: Negative.   Gastrointestinal: Negative for constipation.  Neurological: Positive for focal weakness.   Objective: Vital Signs: Blood pressure 114/78, pulse 99, temperature 98 F (36.7 C), temperature source Oral, resp. rate 16, SpO2 93.00%. No results found. Results for orders placed during the hospital encounter of 04/06/11 (from the past 72 hour(s))  MRSA PCR SCREENING     Status: Normal   Collection Time   04/06/11  5:11 PM      Component Value Range Comment   MRSA by PCR NEGATIVE  NEGATIVE    CBC     Status: Abnormal   Collection Time   04/07/11  5:45 AM      Component Value Range Comment   WBC 6.7  4.0 - 10.5 (K/uL)    RBC 6.77 (*) 4.22 - 5.81 (MIL/uL)    Hemoglobin 13.4  13.0 - 17.0 (g/dL)    HCT 40.9  81.1 - 91.4 (%)    MCV 58.8 (*) 78.0 - 100.0 (fL)    MCH 19.8 (*) 26.0 - 34.0 (pg)    MCHC 33.7  30.0 - 36.0 (g/dL)    RDW 78.2 (*) 95.6 - 15.5 (%)    Platelets 232  150 - 400 (K/uL)   COMPREHENSIVE METABOLIC PANEL     Status: Abnormal   Collection Time   04/07/11  5:45 AM      Component Value Range Comment   Sodium 139  135 - 145 (mEq/L)    Potassium 4.2  3.5 - 5.1 (mEq/L)    Chloride 105  96 - 112 (mEq/L)    CO2 23  19 - 32 (mEq/L)    Glucose, Bld 108 (*) 70 - 99 (mg/dL)    BUN 12  6 - 23 (mg/dL)    Creatinine, Ser 2.13  0.50 - 1.35 (mg/dL)    Calcium 8.8  8.4 - 10.5 (mg/dL)    Total Protein 7.4  6.0 - 8.3 (g/dL)    Albumin 3.3 (*) 3.5 - 5.2 (g/dL)    AST 33  0 - 37 (U/L)    ALT 30  0 - 53 (U/L)    Alkaline Phosphatase 81  39 - 117 (U/L)    Total Bilirubin 0.5  0.3 - 1.2 (mg/dL)    GFR  calc non Af Amer 89 (*) >90 (mL/min)    GFR calc Af Amer >90  >90 (mL/min)   DIFFERENTIAL     Status: Normal   Collection Time   04/07/11  5:45 AM      Component Value Range Comment   Neutrophils Relative 58  43 - 77 (%)    Lymphocytes Relative 29  12 - 46 (%)    Monocytes Relative 9  3 - 12 (%)    Eosinophils Relative 3  0 - 5 (%)    Basophils Relative 1  0 - 1 (%)    Neutro Abs 3.9  1.7 - 7.7 (K/uL)    Lymphs Abs 1.9  0.7 - 4.0 (K/uL)    Monocytes Absolute 0.6  0.1 - 1.0 (K/uL)    Eosinophils Absolute 0.2  0.0 - 0.7 (K/uL)    Basophils Absolute 0.1  0.0 - 0.1 (K/uL)    RBC Morphology TARGET CELLS      HENT:  Head: Normocephalic.  Neck: Normal range of motion. No thyromegaly present.  Cardiovascular: Normal rate and regular rhythm.  Pulmonary/Chest: Breath sounds normal. He has no wheezes.  Abdominal: He exhibits no distension. There is no tenderness.  Musculoskeletal: He exhibits no edema.  Neurological: He is alert.  Follows basic two-step motor commands. Answers simple questions. There is a language barrier but patient can communicate his needs. Patient with 2-/5 strength intermittently  right biceps, grip and deltoid. He is 3- out of 5 right lower extremity except 0/5 ankle DF . Is a right central 7 and tongue deviation. Speech is slightly less dysarthric. Sensation is right arm and leg ok. Reflexes are 1+ throughout.  Skin: Skin is warm and dry.  Psychiatric: He has a normal mood and affect  CN central 7 on R  Assessment/Plan: 1. Functional deficits secondary to L pontine infarct causing R hemiparesis which require 3+ hours per day of interdisciplinary therapy in a comprehensive inpatient rehab setting. Physiatrist is providing close team supervision and 24 hour management of active medical problems listed below. Physiatrist and rehab team continue to assess barriers to discharge/monitor patient progress toward functional and medical goals.   2. Anticoagulation/DVT  prophylaxis with Pharmaceutical: Lovenox 3. Pain Management: Tylenol  Medical Problem List and Plan:  1. left paracentral pontine infarction. Continue aspirin therapy  May need AFO 2. DVT Prophylaxis/Anticoagulation: Subcutaneous Lovenox. Following platelets.h/h stable recheck CBC 3. Dysphagia. Regular diet/ thin liquids. Tolerating well. Appetite improved 4. Hyperlipidemia. Crestor  5. Hypertension. Currently on no antihypertensive medications. Will monitor the increased activity  6. Coronary artery disease. CABG 06. Continue aspirin  7. Medical noncompliance. Provide counseling and discuss with family. 8. Low grade 99.5 temp x 1 will monitor no symptoms.  LOS (Days) 2 A FACE TO FACE EVALUATION WAS PERFORMED

## 2011-04-24 NOTE — Progress Notes (Signed)
Occupational Therapy Weekly Progress Note  Patient Details  Name: Edward Choi MRN: [id number] Date of Birth: 02/22/1951  Today's Date: 04/24/2011 Time: 0800-0900 Time Calculation (min): 60 min  Patient has met 5 of 5 short term goals.  Patient continues to demonstrate the following deficits: right hemiplegia, apraxia, abnormal movement patterns during functional mobility, BADL and IADL, therefore will continue to benefit from skilled OT intervention to enhance overall performance with ADL and iADL.  Patient progressing toward long term goals yet 2 LTGs downgraded.  Plan of care revisions: RUE from gross assist to stabilizer with min cues and Supervision with LB dressing secondary to now donns AFO.  Week Two OT Short Term Goals OT Short Term Goal 1: Patient will bathe with supervision in shower in sit and stand OT Short Term Goal 1 - Progress: Met OT Short Term Goal 2: Patient will dress UB & LB with supervision in sit and stand OT Short Term Goal 2 - Progress: Met OT Short Term Goal 3: Patient will perform toilet transfers and toileting with supervision OT Short Term Goal 3 - Progress: Met OT Short Term Goal 4: Patient will use RUE as a stabilizer during all ADL tasks with min cues OT Short Term Goal 4 - Progress: Not met (LTG Downgraded from gross assist to stabilizer) OT Short Term Goal 5: Pt will demonstrate self ROM exercises for RUE with minimal cues. OT Short Term Goal 5 - Progress: Met  Week Three OT Short Term Goals = LTGs of overall Mod I (except RUE as stabilizer with min cues & LB dressing Supervision due to need to now donn AFO).  Therapy Documentation Precautions: Precautions Precautions: Fall Precaution Comments: requal diet with thin liquids, no straw, right hemiplegia Required Braces or Orthoses: No Restrictions Weight Bearing Restrictions: No  Skilled Therapeutic Interventions/Progress Updates:  1:1 session- Self care retraining to include shower, groom,  dress, ambulate with and without AFO as well as with and without RW.  Focus session on normal movement patterns with all functional mobility, weight bearing through RUE & RLE during ADL tasks in sit and in stand, techniques to donn AFO.  Patient reports he does not like the AFO therefore lengthy discussion regarding abnormal movement patterns that occur when patient does not use it.   Pain Pain Assessment Pain Assessment: No/denies pain ADL ADL Eating: Set up Where Assessed-Eating: Chair Grooming: Minimal cueing Where Assessed-Grooming: Standing at sink Upper Body Bathing: Supervision/safety Where Assessed-Upper Body Bathing: Shower Lower Body Bathing: Supervision/safety Where Assessed-Lower Body Bathing: Shower Upper Body Dressing: Setup Where Assessed-Upper Body Dressing: Chair Lower Body Dressing: Minimal assistance (now donns AFO as part of LB dressing) Where Assessed-Lower Body Dressing: Chair Toileting: Supervision/safety Where Assessed-Toileting: Teacher, adult education: Close supervision Toilet Transfer Method: Surveyor, minerals: Grab bars;Raised Scientist, research (physical sciences): Not assessed Film/video editor: Insurance underwriter Method: Warden/ranger: Emergency planning/management officer  Therapy/Group: Individual Therapy  Senon Nixon 04/24/2011, 2:27 PM

## 2011-04-24 NOTE — Progress Notes (Signed)
Physical Therapy Session Note  Patient Details  Name: Edward Choi MRN: [id number] Date of Birth: [birth year]  Today's Date: 04/24/2011 Time: [phone] and [id number]  Time Calculation (min): 58 min and 41 min  Precautions: Precautions Precautions: Fall Precaution Comments: requal diet with thin liquids, no straw, right hemiplegia Required Braces or Orthoses: No Restrictions Weight Bearing Restrictions: No  Short Term Goals: PT Short Term Goal 1: Patient will perform bed mobility flat bed to L and R with consistent min A with minimal verbal and tactile cues. PT Short Term Goal 1 - Progress: Met PT Short Term Goal 2: Patient will perform bed  <> chair transfers to L and R with consistent min A with intermittent verbal and tactile cues. PT Short Term Goal 2 - Progress: Met PT Short Term Goal 3: Patient will perform w/c mobility on unit with L hemi technique with min A PT Short Term Goal 3 - Progress: Met PT Short Term Goal 4: Patient will perform gait training x 100' with LRAD and mod A. PT Short Term Goal 4 - Progress: Met PT Short Term Goal 5: Patient will perform up and down 3 stairs with one rail and mod A for home entry/exit. PT Short Term Goal 5 - Progress: Met  Pain Pain Assessment Pain Assessment: No/denies pain Pain Score: 0-No pain Locomotion  Ambulation Ambulation: Yes Ambulation/Gait Assistance: 3: Mod assist Ambulation Distance (Feet): 25 Feet (x 2 ) and 150 x 2 in pm Assistive device: Rolling walker;Other (Comment) (R AFO) Ambulation/Gait Assistance Details: Tactile cues for posture;Tactile cues for weight shifting;Manual facilitation for placement;Verbal cues for sequencing;Verbal cues for gait pattern Ambulation/Gait Assistance Details (indicate cue type and reason): Gait training and re-assessment with R AFO and RW with hand splint; improved foot clearance and no episodes of genu recurvatum; still requires assistance to advance RLE through hip and knee flexion  instead of trunk extension, facilitation of hip ABD to prevent scissoring and to facilitate hip IR with manual cues at trunk to minimize lateral flexion.   Stairs / Additional Locomotion Stairs: Yes Stairs Assistance: 3: Mod assist Stairs Assistance Details: Tactile cues for sequencing;Tactile cues for weight shifting;Tactile cues for placement;Visual cues/gestures for sequencing;Verbal cues for sequencing;Verbal cues for technique;Verbal cues for precautions/safety Stairs Assistance Details (indicate cue type and reason): Patient has RW at home and would like to use RW with hand splint instead of cane; discussed how patient would enter home from side entrance without rails and without cane; discussed using front entrance with R rail; demonstrated to patient stair negotiation stepping laterally to allow for LUE support on R rail and leading with LLE to ascend and with RLE to descend with mod A for weight shift and placement of RLE.  Patient states that he will have to ambulate over grass to get to front entrance.  Will need to practice with wife.   Stair Management Technique: One rail Right;Step to pattern;Sideways;Other (comment) (R AFO) Number of Stairs: 4 in am and 4 in pm Height of Stairs: 6 and 6.5 in pm  Other Treatments Treatments Neuromuscular Facilitation: Right;Lower Extremity;Activity to increase coordination;Activity to increase motor control;Activity to increase timing and sequencing;Activity to increase sustained activation in L sidelying for gravity minimized training of RLE isolated activation of hip and knee flexors <> hip and knee extensors to mimic gait pattern in open chain with manual and tactile cues to minimize substitution from trunk and LLE; RLE hip ABD and trunk training in closed chain single leg stance with manual facilitation to  maintain upright trunk and sustained activation of RLE hip stabilization muscles. PM session continued NMR of trunk and R hip stabilization muscles  with closed chain R hip ABD during R side stepping pushing weighted object (for resistance) with RLE with manual and tactile cues to maintain upright trunk and prevent L lateral flexion.  PM: car transfer training with RW; patient states his wife drives a tall van; with simulated car patient unable to sit buttocks into car first secondary to height.  Practiced 3 reps of holding car overhead handle and stepping L foot in first, sitting on seat and use of LUE and hip and knee flexion to bring RLE into and out of car; patient able to perform with min A.     Therapy/Group: Individual Therapy  [name] [facility] 04/24/2011, 12:28 PM

## 2011-04-24 NOTE — Progress Notes (Signed)
Continent of bowel and bladder. Uses toilet for bowel and bladder elimination. Last bowel movement 04/23/11. Ambulates with rolling walker with supervision. Remembers to place right hand in splint attached to the rolling walker without cues or reminder from staff. Calls appropriate for toileting or personal needs (i.e water). No unsafe behavior noted. Resting in chair between therapy sessions.

## 2011-04-25 NOTE — Progress Notes (Signed)
Occupational Therapy Session Note  Patient Details  Name: Tryston Gilliam MRN: [id number] Date of Birth: 1950/05/22  Today's Date: 04/25/2011 Time: 1478-2956 Time Calculation (min): 46 min   Skilled Therapeutic Interventions/Progress Updates:    Practice simple meal prep in the kitchen with use of RW with hand splint on the right side.  Pt overall supervison for safety with min instructional cueing for correct technique used to transport items around the kitchen using his walker and the available flat surfaces.  Reviewed ways to compensate for opening of bottles when the lid is too tight for him to use the RUE as a stabilizer.  Also educated pt on self AAROM exercises for the RUE on table or counter top.  Pt still overcompensating with his body and trunk with attempts to move the RUE.  Therapy Documentation Precautions:  Precautions Precautions: Fall Precaution Comments: requal diet with thin liquids, no straw, right hemiplegia Required Braces or Orthoses: No Restrictions Weight Bearing Restrictions: No  Pain:  No report of pain  ADL: See FIM for current functional status  Therapy/Group: Individual Therapy  Forest Pruden OTR/L 04/25/2011, 3:38 PM

## 2011-04-25 NOTE — Progress Notes (Signed)
Occupational Therapy Session Note  Patient Details  Name: Edward Choi MRN: [id number] Date of Birth: 1951-03-07  Today's Date: 04/25/2011 Time: 1000-1100 Time Calculation (min): 60 min  Week Three OT Short Term Goals = LTGs of overall Mod I (except RUE as stabilizer with min cues & LB dressing Supervision due to need to now donn AFO).  Skilled Therapeutic Interventions:  Self care retraining to include ambulate to therapy apartment shower in tub/shower combination with tub bench and hand held shower, dressing, use of RUE as stabilizer.  Focus session on safety, techniques to improve independence with donn R AFO and shoe, increase normal movement patterns with facilitation, techniques to use RUE as stabilizer to become more independent for meal set up (open packages).  Precautions:  Precautions: Fall Precaution Comments: requal diet with thin liquids, no straw, right hemiplegia Required Braces or Orthoses: No Weight Bearing Restrictions: No Pain: No complaint of pain  See FIM for current functional status  Therapy/Group: Individual Therapy  Asante Ritacco 04/25/2011, 3:37 PM

## 2011-04-25 NOTE — Progress Notes (Signed)
Physical Therapy Session Note  Patient Details  Name: Edward Choi MRN: [id number] Date of Birth: 1950-08-24  Today's Date: 04/25/2011 Time: 0900-0958 Time Calculation (min): 58 min  Therapy Documentation Precautions:  Precautions Precautions: Fall Precaution Comments: requal diet with thin liquids, no straw, right hemiplegia Required Braces or Orthoses: No Restrictions Weight Bearing Restrictions: No Pain: Pain Assessment Pain Assessment: No/denies pain Locomotion : Ambulation Ambulation: Yes Ambulation/Gait Assistance: 3: Mod assist Ambulation Distance (Feet): 150 Feet (x 2 + 260' on treadmill) Assistive device: Rolling walker;Other (Comment) (R AFO and treadmill) Ambulation/Gait Assistance Details: Tactile cues for posture;Manual facilitation for placement;Manual facilitation for weight shifting;Visual cues/gestures for sequencing;Verbal cues for sequencing Ambulation/Gait Assistance Details (indicate cue type and reason): Gait training on unit with RW/hand splint and R AFO level surface with min-mod A and facilitation at trunk for core and oblique activation for increased stability; continued rhythmic gait training on treadmill x 5:30 at .6 mph with manual facilitation of RLE for full advancement, placement, hip IR and full terminal extension with verbal cues for upright trunk.   Other Treatments: Treatments Neuromuscular Facilitation: Right;Lower Extremity;Forced use;Activity to increase motor control;Activity to increase timing and sequencing;Activity to increase sustained activation;Activity to increase lateral weight shifting for activation of RLE extensors and hip stabilization muscles and isolated activation of obliques for lateral weight shift and trunk flexion and elongation during sitting on therapy ball, sitting on mat (secondary to LLE fatigue), and in standing adding in lateral reaching to L with manual facilitation at ribs to minimize trunk extension and initiation  with head  See FIM for current functional status  Therapy/Group: Individual Therapy  [name] [facility] 04/25/2011, 10:26 AM

## 2011-04-25 NOTE — Progress Notes (Signed)
Physical Therapy Session Note  Patient Details  Name: Edward Choi MRN: [id number] Date of Birth: 1950/12/07  Today's Date: 04/25/2011 Time: 7829-5621 Time Calculation (min): 32 min  See LTGs  Skilled Therapeutic Interventions/Progress Updates:    NMR to increase trunk control RUE use and RLE hip stability and improve quality of R swing phase. Seated trunk work and pushing thru RUE, Standing with perturbations, step up 6 inch step forward and laterally with RLE, also LLE; active assisted swing with facilitation to improve hip and knee flexion; progressed to gait with RW with grip for R hand  Therapy Documentation Precautions:  Fall, wears R AFO     Pain: Pain Assessment Pain Assessment: No/denies pain   Lher Treatments: Treatments Neuromuscular Facilitation: Right;Lower Extremity;Forced use;Activity to increase motor control;Activity to increase timing and sequencing;Activity to increase sustained activation;Activity to increase lateral weight shifting  See FIM for current functional status  Therapy/Group: Individual Therapy  Michaelene Song 04/25/2011, 12:09 PM

## 2011-04-25 NOTE — Progress Notes (Signed)
Patient ID: Edward Choi, male   DOB: [birth year], 61 y.o.   MRN: [id number] Limited English but indicates no complaints. Able to move R UE more.Encouraged with progress.Pt denies feeling bad.  Going home 14th!  What is my cholesterol? Subjective/Complaints: Having bowel movement everyday per pt. Review of Systems  Respiratory: Negative.   Cardiovascular: Negative.   Gastrointestinal: Negative for constipation.  Neurological: Positive for focal weakness.   Objective: Vital Signs: Blood pressure 114/78, pulse 99, temperature 98 F (36.7 C), temperature source Oral, resp. rate 16, SpO2 93.00%. No results found. Results for orders placed during the hospital encounter of 04/06/11 (from the past 72 hour(s))                                                                                                                                                                                                                                                                         HENT:  Head: Normocephalic.  Neck: Normal range of motion. No thyromegaly present.  Cardiovascular: Normal rate and regular rhythm.  Pulmonary/Chest: Breath sounds normal. He has no wheezes.  Abdominal: He exhibits no distension. There is no tenderness.  Musculoskeletal: He exhibits no edema.  Neurological: He is alert.  Follows basic two-step motor commands. Answers simple questions. There is a language barrier but patient can communicate his needs. Patient with 2-/5 strength intermittently  right biceps, grip and deltoid. He is 3- out of 5 right lower extremity except 0/5 ankle DF . Is a right central 7 and tongue deviation. Speech is slightly less dysarthric. Sensation is right arm and leg ok. Reflexes are 1+ throughout.  Skin: Skin is warm and dry.  Psychiatric: He has a normal mood and affect  CN central 7 on R  Assessment/Plan: 1. Functional deficits secondary to L  pontine infarct causing R hemiparesis which require 3+ hours per day of interdisciplinary therapy in a comprehensive inpatient rehab setting. Physiatrist is providing close team supervision and 24 hour management of active medical problems listed below. Physiatrist and rehab team continue to assess barriers to discharge/monitor patient progress toward functional and medical goals.   2. Anticoagulation/DVT prophylaxis with Pharmaceutical: Lovenox 3. Pain Management: Tylenol  Medical Problem List and Plan:  1. left paracentral  pontine infarction. Continue aspirin therapy  May need AFO 2. DVT Prophylaxis/Anticoagulation: Subcutaneous Lovenox. Following platelets.h/h stable recheck CBC looks ok 3. Dysphagia. Regular diet/ thin liquids. Tolerating well. Appetite improved 4. Hyperlipidemia. Crestor recheck lipid panel        5. Hypertension. Currently on no antihypertensive medications. Will monitor the increased activity  6. Coronary artery disease. CABG 06. Continue aspirin  7. Medical noncompliance. Provide counseling and discuss with family.   LOS (Days) 2 A FACE TO FACE EVALUATION WAS PERFORMED

## 2011-04-26 LAB — LIPID PANEL
LDL Cholesterol: 73 mg/dL (ref 0–99)
Triglycerides: 126 mg/dL (ref <150)
VLDL: 25 mg/dL (ref 0–40)

## 2011-04-26 MED ORDER — ROSUVASTATIN CALCIUM 40 MG PO TABS: 40.0000 mg | ORAL_TABLET | Freq: Every day | ORAL | Status: DC

## 2011-04-26 MED ORDER — ASPIRIN EC 325 MG PO TBEC: 325.0000 mg | DELAYED_RELEASE_TABLET | Freq: Every day | ORAL | Status: AC

## 2011-04-26 NOTE — Progress Notes (Signed)
Occupational Therapy Session Note  Patient Details  Name: Bertram Haddix MRN: [id number] Date of Birth: 1950/06/02  Today's Date: 04/26/2011 Time: 1316-1400 Time Calculation (min): 44 min  Skilled Therapeutic Interventions/Progress Updates:    Worked on neuromuscular re-ed for the RUE.  Worked in quadriped with emphasis on supporting his bodyweight with the RUE while reaching with the LUE.  Pt still needs significant mod assist for support while reaching so he doesn't lose his balance.  Transitioned to sitting with focus on simple shoulder elbow activation while not overcompensating with his trunk.  Pt able to elicit small internal and external rotation AROM while maintaining his hand on a ball.  Finished by having pt lay in supine and perform sustained shoulder flexion to 90 degrees using a dowel rod, with the RUE ace bandaged on the dowel.  Pt unable to sustain active elbow extension with the RUE with the shoulder at 90 degrees.  Therapy Documentation Precautions:  Precautions Precautions: Fall Precaution Comments: requal diet with thin liquids, no straw, right hemiplegia Required Braces or Orthoses: No Restrictions Weight Bearing Restrictions: No  Pain:  No report of pain  ADL: See FIM for current functional status  Therapy/Group: Individual Therapy  Imad Shostak OTR/L 04/26/2011, 4:21 PM

## 2011-04-26 NOTE — Progress Notes (Signed)
Physical Therapy Session Note  Patient Details  Name: Edward Choi MRN: [id number] Date of Birth: 1950/04/13  Today's Date: 04/26/2011 Time: 1137-1205 Time Calculation (min): 28 min Met  Skilled Therapeutic Interventions/Progress Updates: Gait training with RW x 150' x 2 focusing on RLE adduction, IR via manual contacts, min-mod a for improving movement pattern.  Ascended/descended 4 steps with rail on R, sideways, holding rail with L hand., with min A.  Neuromuscular re-education R hip muscles via forced use, manual contacts, verbal and visual cues.  Trunk neuromuscular re-education during wt shifting, trunk shortening and lengthening L and R.  Pt continues to have difficulty eliciting R trunk lengthening in a normal pattern to demonstrate head and trunk righting responses.       Therapy Documentation Precautions:  Precautions Precautions: Fall Precaution Comments: requal diet with thin liquids, no straw, right hemiplegia Required Braces or Orthoses: No Restrictions Weight Bearing Restrictions: No      Therapy/Group: Individual Therapy  Mina Babula 04/26/2011, 2:28 PM

## 2011-04-26 NOTE — Progress Notes (Signed)
Social Work Patient ID: Edward Choi, male   DOB: 1950/10/11, 61 y.o.   MRN: [id number]  Met with pt to inform wife is coming in tomorrow at 1:30 to go through education, then be discharged. He doesn't want a wheelchair and has all of his equipment from previous admissions.  Pt to receive Home Health  Follow up and has a Primary MD appt for 2/19 at 1:45.  Wrap up discharge tomorrow when wife is here.

## 2011-04-26 NOTE — Discharge Summary (Signed)
NAMETRAMELL, PIECHOTA           ACCOUNT NO.:  [id number]  MEDICAL RECORD NO.:  [id number]  LOCATION:  4140                         FACILITY:  [facility]  PHYSICIAN:  Erick Colace, M.D.DATE OF BIRTH:  1950-06-14  DATE OF ADMISSION:  04/06/2011 DATE OF DISCHARGE:  04/27/2011                              DISCHARGE SUMMARY   DISCHARGE DIAGNOSES:  Left paracentral pontine infarction.  Subcutaneous Lovenox for deep vein thrombosis prophylaxis, dysphagia, hyperlipidemia, hypertension, coronary artery disease, and medical noncompliance.  A 61 year old right-handed [place] male with history of coronary artery disease with bypass grafting in 2006.  The patient on no medications prior to hospital admission with questionable medical compliance.  The patient was independent prior to admission.  He was admitted January 21 with right-sided weakness.  MRI of the brain showed left paracentral pontine, acute nonhemorrhagic infarction.  MRA of the head with mild intracranial atherosclerotic type changes.  The patient did not receive tPA.  Echocardiogram with ejection fraction of 60% without emboli. Carotid Dopplers with no significant extracranial carotid artery stenosis.  Neurology Services consulted, placed on aspirin therapy as well as subcutaneous Lovenox.  Physical therapy evaluation January 23 advised inpatient rehab services.  The patient was admitted for comprehensive rehab program.  PAST MEDICAL HISTORY:  See discharge diagnoses.  ALLERGIES:  None.  SOCIAL HISTORY:  Lives with family, assistance as needed.  FUNCTIONAL HISTORY PRIOR TO ADMISSION:  The patient was moderate assist for sit to stand, moderate assist for stand pivot transfer, required +1 total assist to ambulate 3 feet.  Functional status prior to hospital admission was independent.  PHYSICAL EXAMINATION:  VITAL SIGNS:  Blood pressure 130/76, pulse 82, temperature 96, respirations 18. GENERAL:  This was an alert male,  very limited [place]. HEAD:  Normocephalic. LUNGS:  Clear to auscultation. CARDIAC:  Regular rate and rhythm. ABDOMEN:  Soft, nontender.  Good bowel sounds. NEUROLOGIC:  Follows basic 2-step commands.  Answers simple questions. The patient with 1/5 strength intermittently throughout the right upper extremity, 1- to 1+/5 right lower extremity.  REHABILITATION HOSPITAL COURSE:  The patient was admitted to Inpatient Rehab Services with therapies initiated on a 3 hour daily basis, consisting of physical therapy, occupational therapy, and rehabilitation nursing.  The following issues were addressed during the patient's rehabilitation stay.  Pertaining to Mr. Tindol, left paracentral pontine infarction remained stable, maintained on aspirin therapies as he continued to attend therapies.  Maintained on subcutaneous Lovenox for deep vein thrombosis prophylaxis.  His diet was steadily advanced to a mechanical soft, which he tolerated nicely.  His blood pressures were well controlled on no present antihypertensive medications.  He remained on Crestor for hyperlipidemia.  The patient did have a history of medical noncompliance, received counseling in regards to maintaining his simple regimen at this time of aspirin and Crestor.  The patient received weekly collaborative interdisciplinary team conferences to discuss estimated length of stay, family teaching and any barriers to discharge.  He was continent of bowel and bladder.  Steady assistance with activities of daily living, supervision with basic activities of daily living, increasing right upper extremity movement noted. Supervision transfers and wheelchair mobility.  Moderate assist ambulation with the use of the [place], max assist for  stairs.  Ongoing therapies would be dictated as per [employer] and full family teaching was completed.  Latest labs showed a sodium of 139, potassium 4.2, BUN 12, creatinine 0.9, hemoglobin 13.4,  hematocrit 39.8, platelet 232,000.  DISCHARGE MEDICATIONS:  At time of dictation included: 1. Aspirin 325 mg daily. 2. Crestor 40 mg daily. 3. Tylenol as needed.  DIET:  Mechanical soft diet.  SPECIAL INSTRUCTIONS:  Ongoing therapies as dictated per [employer]. The patient should [facility].  Follow up with Dr. Egbert Garibaldi, medical management, 2 weeks, call for appointment.  The patient was advised no driving.     Mariam Dollar, P.A.   ______________________________ Erick Colace, M.D.    DA/MEDQ  D:  04/26/2011  T:  04/26/2011  Job:  [id number]  cc:   Erick Colace, M.D.

## 2011-04-26 NOTE — Progress Notes (Signed)
Occupational Therapy Session Note  Patient Details  Name: Edward Choi MRN: [id number] Date of Birth: [birth year]  Today's Date: 04/26/2011 Time: 1000-1100 Time Calculation (min): 60 min  Skilled Therapeutic Interventions:  Self care retraining to include ambulate with right AFO and RW with R hand splint to therapy bathroom for shower using tub/shower combination.  Patient overall supervision-Mod I for BADL tasks.  Patient using RUE as stabilizer with grooming tasks without cues.  Therapy Documentation Precautions:  Precautions Precautions: Fall Precaution Comments: requal diet with thin liquids, no straw, right hemiplegia Required Braces or Orthoses: No Restrictions Weight Bearing Restrictions: No Pain: Pain Assessment Pain Assessment: No/denies pain  See FIM for current functional status  Therapy/Group: Individual Therapy  Weslynn Ke 04/26/2011, 12:40 PM

## 2011-04-26 NOTE — Progress Notes (Signed)
Patient ID: Edward Choi, male   DOB: 07-28-1950, 61 y.o.   MRN: [id number] Patient ID: Edward Choi, male   DOB: 03-29-1950, 61 y.o.   MRN: [id number] Limited English but indicates no complaints. Able to move R UE more.Encouraged with progress.Pt denies feeling bad.  Going home 14th!  What is my cholesterol? Subjective/Complaints: Having bowel movement everyday per pt. Review of Systems  Respiratory: Negative.   Cardiovascular: Negative.   Gastrointestinal: Negative for constipation.  Neurological: Positive for focal weakness.   Objective: Vital Signs: Blood pressure 114/78, pulse 99, temperature 98 F (36.7 C), temperature source Oral, resp. rate 16, SpO2 93.00%. No results found.                                                                                                                                                                                                                                                                         HENT:  Head: Normocephalic.  Neck: Normal range of motion. No thyromegaly present.  Cardiovascular: Normal rate and regular rhythm.  Pulmonary/Chest: Breath sounds normal. He has no wheezes.  Abdominal: He exhibits no distension. There is no tenderness.  Musculoskeletal: He exhibits no edema.  Neurological: He is alert.  Follows basic two-step motor commands. Answers simple questions. There is a language barrier but patient can communicate his needs. Patient with 2-/5 strength intermittently  right biceps, grip and deltoid. He is 3- out of 5 right lower extremity except 0/5 ankle DF . Is a right central 7 and tongue deviation. Speech is slightly less dysarthric. Sensation is right arm and leg ok. Reflexes are 1+ throughout.  Skin: Skin is warm and dry.  Psychiatric: He has a normal mood and affect  CN central 7 on R  Assessment/Plan: 1. Functional deficits secondary to L pontine infarct  causing R hemiparesis which require 3+ hours per day of interdisciplinary therapy in a comprehensive inpatient rehab setting. Physiatrist is providing close team supervision and 24 hour management of active medical problems listed below. Physiatrist and rehab team continue to assess barriers to discharge/monitor patient progress toward functional and medical goals. Plan d/c in am  2. Anticoagulation/DVT prophylaxis with Pharmaceutical: Lovenox 3. Pain Management: Tylenol  Medical Problem List and Plan:  1. left paracentral pontine infarction. Continue aspirin therapy  May need AFO 2. DVT Prophylaxis/Anticoagulation: Subcutaneous Lovenox. Following platelets.h/h stable recheck CBC looks ok 3. Dysphagia. Regular diet/ thin liquids. Tolerating well. Appetite improved 4. Hyperlipidemia. Crestor recheck lipid panel much improved- both cholesterol and LDL normalized              5. Hypertension. Currently on no antihypertensive medications. Will monitor the increased activity  6. Coronary artery disease. CABG 06. Continue aspirin  7. Medical noncompliance. Provide counseling and discuss with family.   LOS (Days) 2 A FACE TO FACE EVALUATION WAS PERFORMED

## 2011-04-26 NOTE — Progress Notes (Addendum)
Physical Therapy Session Note  Patient Details  Name: Edward Choi MRN: [id number] Date of Birth: 03-Apr-1950  Today's Date: 04/26/2011 Time: 0804-0900 Time Calculation (min): 56 min  Short Term Goals: No short term goals set  Therapy Documentation Precautions:  Precautions Precautions: Fall Precaution Comments: requal diet with thin liquids, no straw, right hemiplegia Required Braces or Orthoses: No Restrictions Weight Bearing Restrictions: No  Pain: Pain Assessment Pain Assessment: No/denies pain  Locomotion : Ambulation Ambulation/Gait Assistance: 4: Min assist with RW with hand splint and RAFO with improved upright trunk and decreased lateral lean during gait on level surface  Other Treatments: Treatments Neuromuscular Facilitation: Right;Lower Extremity;Activity to increase motor control;Activity to increase timing and sequencing;Activity to increase sustained activation;Activity to increase lateral weight shifting;Other (comment) (Core activation and trunk control) during tall kneeling on mat beginning with bilat UE support >> no UE support during tall kneeling squats in midline, to L and R, tall kneeling R hip ABD/ADD side stepping and R half kneeling with min-mod assist for balance and tactile cues and visual feedback of mirror for core and hip stabilization muscle activation; performed standing trunk control/activation training with mirror and holding 10lb weighted object in LUE statically and during L lateral flexion <> elongation and with forward and retro stepping to facilitate activation of R sided core muscles for stabilization  See FIM for current functional status  Therapy/Group: Individual Therapy  [name] [facility] 04/26/2011, 8:43 AM

## 2011-04-27 DIAGNOSIS — Z5189 Encounter for other specified aftercare: Secondary | ICD-10-CM

## 2011-04-27 DIAGNOSIS — G8100 Flaccid hemiplegia affecting unspecified side: Secondary | ICD-10-CM

## 2011-04-27 DIAGNOSIS — I6330 Cerebral infarction due to thrombosis of unspecified cerebral artery: Secondary | ICD-10-CM

## 2011-04-27 NOTE — Progress Notes (Signed)
Social Work Discharge Note Discharge Note  The overall goal for the admission was met for:   Discharge location: Yes-HOME WITH WIFE AND MOTHER IN-LAW TO PROVIDE SUPERVISION  Length of Stay: Yes-21 DAYS  Discharge activity level: Yes-SUPERVISION/MOD/I LEVEL  Home/community participation: Yes  Services provided included: MD, RD, PT, OT, SLP, RN, CM, TR, Pharmacy and SW  Financial Services: Private Insurance: BCBS  Follow-up services arranged: Home Health: [facility] and Patient/Family has no preference for HH/DME agencies  Comments (or additional information): FAMILY EDUCATION TODAY-MD APPT DR.SLATOSKY 2/19 1:45 AWARE OF IMPORTANCE OF TAKING MEDS AND FOLLOWING UP WITH MD.  Patient/Family verbalized understanding of follow-up arrangements: Yes  Individual responsible for coordination of the follow-up plan: EM-WIFE WIFE TO FOLLOW UP WITH MEDICAID APPLICATION [place] [facility] [place]  Confirmed correct DME delivered: Lucy Chris 04/27/2011    Lucy Chris

## 2011-04-27 NOTE — Progress Notes (Signed)
Occupational Therapy Session Note  Patient Details  Name: Edward Choi MRN: [id number] Date of Birth: Feb 18, 1951  Today's Date: 04/27/2011  Precautions:  Precautions: Fall Precaution Comments: regular diet, thin liquids, right hemiplegia Required Braces or Orthoses: Yes Other Brace/Splint: Right AFO Weight Bearing Restrictions: No Pain: Pain Assessment Pain Assessment: No/denies pain  1st Session Time: 1000-1115 Time Calculation (min): 75 min  Skilled Therapeutic Interventions:  ADL: Equipment Provided: Other (comment) (bath mitt for RUE) Eating: Modified independent Where Assessed-Eating: Chair Grooming: Modified independent Where Assessed-Grooming: Standing at sink Upper Body Bathing: Modified independent Where Assessed-Upper Body Bathing: Shower Lower Body Bathing: Modified independent Where Assessed-Lower Body Bathing: Shower Upper Body Dressing: Modified independent (Device) Where Assessed-Upper Body Dressing: Chair Lower Body Dressing: Modified independent Where Assessed-Lower Body Dressing: Chair Toileting: Modified independent Where Assessed-Toileting: Neurosurgeon Method: Surveyor, minerals: Raised toilet seat;Grab bars Tub/Shower Transfer: Close supervison Web designer Method: Engineer, technical sales: Insurance underwriter: Insurance underwriter Method: Warden/ranger: Emergency planning/management officer  Therapy/Group: Individual Therapy See FIM for current functional status   2nd Session Time:1500-1530 Time Calculation (min):  30 min  Patient & Caregiver Education Session  See FIM for current functional status  Edward Choi 04/27/2011, 4:58 PM

## 2011-04-27 NOTE — Progress Notes (Signed)
Patient ID: Edward Choi, male   DOB: 05-15-1950, 61 y.o.   MRN: [id number] Meet with pt to report on team conference. Pt in agreement with plan for d/c Wed. Pt 's wife/CG to come for education in AM.

## 2011-04-27 NOTE — Progress Notes (Signed)
Patient discharged with family to home at 67. D/C instructions provided by D. Anguilli, PA to patient and family, understanding verbalized. Edward Choi

## 2011-04-27 NOTE — Progress Notes (Signed)
Physical Therapy Discharge Summary  Patient Details  Name: Edward Choi MRN: [id number] Date of Birth: June 12, 1950 Today's Date: 04/27/2011 Treatment Time: 1417-1458 (41 minutes)  Patient has met 8 of 9 long term goals due to improved balance, improved postural control, increased strength, ability to compensate for deficits, functional use of  right lower extremity, improved attention and improved coordination.  Patient to discharge at an ambulatory level Modified Independent.   Patient's care partner is independent to provide the necessary physical assistance with stairs at discharge.  Reasons goals not met: Patient did not reach supervision level for stairs secondary to impaired balance; requires min A to ascend forwards and descend backwards with UE support on RW for side entrance.  Wife demonstrated independence with assisting patient.  Recommendation:  Patient will benefit from ongoing skilled PT services in home health setting to continue to advance safe functional mobility, address ongoing impairments in R hemiplegia, impaired motor planning and control, impaired postural control, impaired dynamic balance and gait, and minimize fall risk.  Equipment: R hand splint and R solid ankle AFO  Reasons for discharge: treatment goals met and discharge from hospital  Patient/family agrees with progress made and goals achieved: Yes  PT Discharge Pain Pain Assessment Pain Assessment: No/denies pain Sensation Sensation Light Touch: Appears Intact Coordination Gross Motor Movements are Fluid and Coordinated: No Coordination and Movement Description: Still increased difficulty with isolated joint movement and motor control.  Increase use of head, trunk and other LE to activate movement persists.   Heel-Shin test: Unable to complete full ROM secondary to decreased strength; increased use of trunk extension for hip and knee flexion. Motor  Motor Motor: Hemiplegia;Ataxia;Motor  apraxia;Abnormal postural alignment and control;Motor impersistence Motor - Discharge Observations: Continues to have R hemiplegia with improved activation of all RLE muscle groups but still with impaired coordination, motor planning and control and impaired postural control during mobility  Mobility Bed Mobility Bed Mobility: Yes Supine to Sit: 6: Modified independent (Device/Increase time) Transfers Transfers: Yes Stand Pivot Transfers: 6: Modified independent (Device/Increase time) Stand Pivot Transfer Details (indicate cue type and reason): With AFO donned and minimal use of UE Locomotion  Ambulation Ambulation: Yes Ambulation/Gait Assistance: 6: Modified independent (Device/Increase time) Ambulation Distance (Feet): 150 Feet Assistive device: Rolling walker;Other (Comment) (R AFO and hand splint) Ambulation/Gait Assistance Details (indicate cue type and reason): Patient mod I with AD but still presents with significant R hip instability with decreased activation of glutes with hip ER during initial contact with poor isolation of hip and knee flexors during terminal stance and swing, decreased anterior translation of COG over R stance LE, increased trunk flexion <> extension and lateral flexion as patient fatigued Stairs / Additional Locomotion Stairs: Yes Stairs Assistance: 4: Min assist Stairs Assistance Details: Visual cues for safe use of DME/AE;Visual cues/gestures for sequencing;Verbal cues for sequencing;Verbal cues for technique;Verbal cues for precautions/safety;Verbal cues for safe use of DME/AE Stairs Assistance Details (indicate cue type and reason): Wife present for education; patient would prefer to use side entrance without rails.  Problem solved way for patient to use RW on stairs; patient places RW inside house with assistance and ascends forwards and descends retro stepping while holding to RW with min A and verbal cues for sequence.  Wife gave repeat demonstration.  Also  demonstrated stair negotiation with L rail if patient chooses to use front entrance. Stair Management Technique: No rails;Step to pattern;Forwards;Backwards;With walker Number of Stairs: 4  Height of Stairs: 6  Wheelchair Mobility Wheelchair Assistance: 6:  Modified independent (Device/Increase time) Wheelchair Propulsion: Left lower extremity;Left upper extremity Wheelchair Parts Management: Independent Distance: 150  Extremity Assessment  RLE Strength RLE Overall Strength: Deficits RLE Overall Strength Comments: improved 2/5 hip and knee strength, 1/5 ankle DF LLE Assessment LLE Assessment: Within Functional Limits  Also reviewed with patient and wife safe van transfer; patient gave repeat demonstration with simulated van stepping into [place] with LLE holding to handle and then bringing his RLE into and out of [place] with supervision. Also demonstrated to wife floor > furniture transfer if patient did experience a fall at home through quadruped > tall kneeling > half kneeling to stand to prevent caregiver injury or injury to R shoulder.  Patient gave repeat demonstration with supervision and verbal cues for sequencing.   See FIM for current functional status  [name] [facility] 04/27/2011, 5:03 PM

## 2011-04-27 NOTE — Plan of Care (Signed)
Problem: RH Stairs Goal: LTG Patient will ambulate up and down stairs w/assist (PT) Outcome: Not Met (add Reason) Will perform side entrance 1 step with RW forwards to enter and backwards to descend. Front entrance with L rail and requires min A for both sequences secondary to impaired balance

## 2011-04-27 NOTE — Discharge Instructions (Signed)
Inpatient Rehab Discharge Instructions  Octavius Schwarzkopf Discharge date and time: No discharge date for patient encounter.   Activities/Precautions/ Functional Status: Activity: activity as tolerated Diet: regular diet Wound Care: none needed Functional status:  ___ No restrictions     ___ Walk up steps independently _x__ 24/7 supervision/assistance   __x_ Walk up steps with assistance ___ Intermittent supervision/assistance  ___ Bathe/dress independently ___ Walk with walker     ___ Bathe/dress with assistance ___ Walk Independently    ___ Shower independently _x__ Walk with assistance    _x STROKE/TIA DISCHARGE INSTRUCTIONS SMOKING Cigarette smoking nearly doubles your risk of having a stroke & is the single most alterable risk factor  If you smoke or have smoked in the last 12 months, you are advised to quit smoking for your health.  Most of the excess cardiovascular risk related to smoking disappears within a year of stopping.  Ask you doctor about anti-smoking medications  [place] Quit Line: 1-800-QUIT NOW  Free Smoking Cessation Classes [phone]  CHOLESTEROL Know your levels; limit fat & cholesterol in your diet  Lab Results  Component Value Date   CHOL 137 04/26/2011   HDL 39* 04/26/2011   LDLCALC 73 04/26/2011   TRIG 126 04/26/2011   CHOLHDL 3.5 04/26/2011      Many patients benefit from treatment even if their cholesterol is at goal.  Goal: Total Cholesterol less than 160  Goal:  LDL less than 100  Goal:  HDL greater than 40  Goal:  Triglycerides less than 150  BLOOD PRESSURE American Stroke Association blood pressure target is less that 120/80 mm/Hg  Your discharge blood pressure is:  BP: 107/69 mmHg  Monitor your blood pressure  Limit your salt and alcohol intake  Many individuals will require more than one medication for high blood pressure  DIABETES (A1c is a blood sugar average for last 3 months) Goal A1c is under 7% (A1c is blood sugar average for last  3 months)  Diabetes: No known diagnosis of diabetes    Lab Results  Component Value Date   HGBA1C 5.5 04/04/2011    Your A1c can be lowered with medications, healthy diet, and exercise.  Check your blood sugar as directed by your physician  Call your physician if you experience unexplained or low blood sugars.  PHYSICAL ACTIVITY/REHABILITATION Goal is 30 minutes at least 4 days per week    Activity decreases your risk of heart attack and stroke and makes your heart stronger.  It helps control your weight and blood pressure; helps you relax and can improve your mood.  Participate in a regular exercise program.  Talk with your doctor about the best form of exercise for you (dancing, walking, swimming, cycling).  DIET/WEIGHT Goal is to maintain a healthy weight  Your height is:    Your current weight is: Weight: 57.6 kg (126 lb 15.8 oz) Your body Mass Index (BMI) is:     Following the type of diet specifically designed for you will help prevent another stroke.  Your goal Body Mass Index (BMI) is 19-24.  Healthy food habits can help reduce 3 risk factors for stroke:  High cholesterol, hypertension, and excess weight.     __ Shower with assistance ___ No alcohol     ___ Return to work/school ________  Special Instructions:  COMMUNITY REFERRALS UPON DISCHARGE:    Home Health:   PT,OT,RN  Agency: [facility] Phone:[phone] Date of last service: 04/27/2011  Medical Equipment/Items Ordered: NO NEEDS HAD FROM  PREVIOUS ADMITS   GENERAL COMMUNITY RESOURCES FOR PATIENT/FAMILY: Support Groups: CVA SUPPORT GROUP WIFE TO FOLLOW UP WITH MEDICAID APPLICATION IN [facility]  [place]  IF QUESTIONS OR CONCERNS ARISE, PLEASE CONTACTDossie Der, LCSW                                                                                                               [phone]  My questions have been answered and I understand these instructions. I will adhere to these goals and the provided  educational materials after my discharge from the hospital.  Patient/Caregiver Signature _______________________________ Date __________  Clinician Signature _______________________________________ Date __________  Please bring this form and your medication list with you to all your follow-up doctor's appointments.

## 2011-04-27 NOTE — Progress Notes (Signed)
Patient ID: Edward Choi, male   DOB: 08-16-1950, 61 y.o.   MRN: [id number] Limited English but indicates no complaints. Able to move R UE more.Encouraged with progress.Pt denies feeling bad.  Going home 14th!  What is my cholesterol? Subjective/Complaints: Having bowel movement everyday per pt. Review of Systems  Respiratory: Negative.   Cardiovascular: Negative.   Gastrointestinal: Negative for constipation.  Neurological: Positive for focal weakness.   Objective: Vital Signs: Blood pressure 114/78, pulse 99, temperature 98 F (36.7 C), temperature source Oral, resp. rate 16, SpO2 93.00%. No results found.                                                                                                                                                                                                                                                                         HENT:  Head: Normocephalic.  Neck: Normal range of motion. No thyromegaly present.  Cardiovascular: Normal rate and regular rhythm.  Pulmonary/Chest: Breath sounds normal. He has no wheezes.  Abdominal: He exhibits no distension. There is no tenderness.  Musculoskeletal: He exhibits no edema.  Neurological: He is alert.  Follows basic two-step motor commands. Answers simple questions. There is a language barrier but patient can communicate his needs. Patient with 2-/5 strength intermittently  right biceps, grip and deltoid. He is 3- out of 5 right lower extremity except 0/5 ankle DF . Is a right central 7 and tongue deviation. Speech is slightly less dysarthric. Sensation is right arm and leg ok. Reflexes are 1+ throughout.  Skin: Skin is warm and dry.  Psychiatric: He has a normal mood and affect  CN central 7 on R  Assessment/Plan: 1. Functional deficits secondary to L pontine infarct causing R hemiparesis stable for D/C, no driving, f/u with primary MD and PMR 2.  Anticoagulation/DVT prophylaxis with Pharmaceutical: Lovenox 3. Pain Management: Tylenol  Medical Problem List and Plan:  1. left paracentral pontine infarction. Continue aspirin therapy  May need AFO 2. DVT Prophylaxis/Anticoagulation:discontinue Subcutaneous Lovenox     .3. Dysphagia. Regular diet/ thin liquids. Tolerating well. Appetite improved 4. Hyperlipidemia. Crestor recheck lipid panel much improved- both cholesterol and LDL normalized emphasized need to continue lifelong  5. Hypertension. Currently on no antihypertensive medications. Will monitor the increased activity  6. Coronary artery disease. CABG 06. Continue aspirin  7. Medical noncompliance. Provide counseling and discuss with family.   LOS (Days) 2 A FACE TO FACE EVALUATION WAS PERFORMED

## 2011-04-27 NOTE — Patient Care Conference (Signed)
Inpatient RehabilitationTeam Conference Note Date: 04/26/2011   Time: 11:05 AM    Patient Name: Edward Choi      Medical Record Number: [id number]  Date of Birth: 1950/04/23 Sex: Male         Room/Bed: 4140/4140-01 Payor Info: Payor: BLUE CROSS BLUE SHIELD  Plan: BCBS PPO OUT OF STATE  Product Type: *No Product type*     Admitting Diagnosis: LT CVA  Admit Date/Time:  04/06/2011  3:58 PM Admission Comments: No comment available   Primary Diagnosis:  Stroke Principal Problem: Stroke  Patient Active Problem List  Diagnoses Date Noted  . Stroke 04/07/2011  . CAD (coronary artery disease) 04/03/2011  . CVA (cerebrovascular accident) 04/03/2011  . Hypertension 04/03/2011  . Depression 04/03/2011  . Hx of CABG 04/03/2011  . Hyperlipidemia 04/03/2011    Expected Discharge Date: Expected Discharge Date: 04/27/11  Team Members Present: Physician: Dr. Claudette Laws Case Manager Present: Lutricia Horsfall, RN Social Worker Present: Dossie Der, LCSW Nurse Present: Laural Roes, RN PT Present: [name], PT OT Present: Bretta Bang, OT SLP Present: Fae Pippin, SLP Perrin Maltese, OT     Current Status/Progress Goal Weekly Team Focus  Medical   Hyper cholesterol in the improved  Instruct compliance medications  Reinforced with patient and with wife need to continue medications at home   Bowel/Bladder   continent bowel/bladder  remain continent  remain continent   Swallow/Nutrition/ Hydration             ADL's   Supervision - Mod I for BADL  Mod I except Supervision for for LB dressing due to AFO and Supervision for Tub/Shower Transfer   RUE NM re-ed. and SROM exercises, normal movement patterns, IADL tasks, caregi   Mobility   supervision transfers, still min-mod A for gait and stairs with AFO   mod I overall except supervision car transfers and stairs   Trunk/core control during gait, family education, finalize D/C planning   Communication               Safety/Cognition/ Behavioral Observations            Pain   no c/o pain  < 3 1-10 scale  continue to monitor   Skin   no current skin issues  no new breakdown  turn q2h prn and use skincare products      *See Interdisciplinary Assessment and Plan and progress notes for long and short-term goals  Barriers to Discharge: Family training    Possible Resolutions to Barriers:  Family training    Discharge Planning/Teaching Needs:  Wife to be here tomorrow at 1:30 to go through family education and then take pt home. To have 24 hour supervision at home      Team Discussion:  Plan to d/c after wife goes through therapy and education tomorrow.Cholesterol improved with Crestor. Continent. Nsg has been educating pt on stroke and meds.  Revisions to Treatment Plan:     Continued Need for Acute Rehabilitation Level of Care: The patient requires daily medical management by a physician with specialized training in physical medicine and rehabilitation for the following conditions: Daily direction of a multidisciplinary physical rehabilitation program to ensure safe treatment while eliciting the highest outcome that is of practical value to the patient.: Yes Daily medical management of patient stability for increased activity during participation in an intensive rehabilitation regime.: Yes Daily analysis of laboratory values and/or radiology reports with any subsequent need for medication adjustment of medical intervention for : Neurological  problems;Other  Meryl Dare 04/27/2011, 12:06 PM

## 2011-04-27 NOTE — Discharge Summary (Signed)
Occupational Therapy Discharge Summary  Patient Details  Name: Edward Choi MRN: [id number] Date of Birth: [birth year] Today's Date: 04/27/2011  Patient has met 12 of 12 long term goals due to improved activity tolerance, improved balance, postural control, ability to compensate for deficits and functional use of  RIGHT upper extremity.  Patient to discharge at overall Modified Independent level.  Patient's care partner is independent to provide the necessary physical assistance at discharge.    Reasons goals not met: N/A secondary to all LTGs met  Recommendation:  Patient will benefit from ongoing skilled OT services in home health setting to continue to advance functional skills in the area of BADL, iADL and Reduce care partner burden.  Equipment: tub bench  Reasons for discharge: treatment goals met and discharge from hospital  Patient/family agrees with progress made and goals achieved: Yes  OT Discharge Precautions/Restrictions  Precautions: Fall Precaution Comments: regular diet, thin liquids, right hemiplegia Required Braces or Orthoses: Yes Other Brace/Splint: Right AFO Weight Bearing Restrictions: No Pain Pain Assessment Pain Assessment: No/denies pain ADL ADL Equipment Provided: Other (comment) (bath mitt for RUE) Eating: Modified independent Where Assessed-Eating: Chair Grooming: Modified independent Where Assessed-Grooming: Standing at sink Upper Body Bathing: Modified independent Where Assessed-Upper Body Bathing: Shower Lower Body Bathing: Modified independent Where Assessed-Lower Body Bathing: Shower Upper Body Dressing: Modified independent (Device) Where Assessed-Upper Body Dressing: Chair Lower Body Dressing: Modified independent Where Assessed-Lower Body Dressing: Chair Toileting: Modified independent Where Assessed-Toileting: Neurosurgeon Method: Surveyor, minerals: Raised  toilet seat;Grab bars Tub/Shower Transfer: Close supervison Web designer Method: Engineer, technical sales: Emergency planning/management officer  Vision/Perception  Vision - History Baseline Vision: No visual deficits Patient Visual Report: No change from baseline (patient reports no longer having blurred vision) Vision - Assessment Eye Alignment: Within Functional Limits Perception Perception: Within Functional Limits Praxis Praxis: Impaired Praxis Impairment Details: Motor planning Praxis-Other Comments: noted with BADL & IADL and with performing SROM exercises  Cognition Overall Cognitive Status: Appears within functional limits for tasks assessed Arousal/Alertness: Awake/alert Orientation Level: Oriented X4 Sensation Sensation Light Touch: Appears Intact (BUEs) Stereognosis: Not tested Hot/Cold: Appears Intact (BUEs) Proprioception: Appears Intact (BUEs) Coordination Gross Motor Movements are Fluid and Coordinated: No (N/A with RUE) Fine Motor Movements are Fluid and Coordinated: No (N/A with RUE) Coordination and Movement Description: N/A secondary to RUE appears flaccid with minimal activation beginning to occur Finger Nose Finger Test: N/A at this time Motor  Motor Motor: Hemiplegia Motor - Discharge Observations: right hemiplegia, mild right GH inferior subluxation, RUE appears flaccid however activation beginning with SHOULDER flex, ext, int & ext rot, ELBOW flex, ext supination, FINGER flex Mobility  Bed Mobility Sitting - Scoot to Edge of Bed: 6: Modified independent (Device/Increase time) Transfers Sit to Stand: 6: Modified independent (Device/Increase time) Sit to Stand Details (indicate cue type and reason): Patient performs safely yet still has difficulty with his quality of movement  Stand to Sit: 6: Modified independent (Device/Increase time) Stand to Sit Details: Patient performs safely yet still has difficulty with his quality of movement   Extremity/Trunk  Assessment RUE Assessment RUE Assessment: Exceptions to [facility] RUE Tone RUE Tone: Hypotonic Hypotonic Details: right hemiplegia, mild right GH inferior subluxation, RUE appears flaccid however activation beginning with SHOULDER flex, ext, int & ext rot, ELBOW flex, ext supination, FINGER flex   See FIM for current functional status  Edward Choi 04/27/2011, 11:21 AM

## 2011-05-19 ENCOUNTER — Inpatient Hospital Stay: Payer: BC Managed Care – PPO | Admitting: Physical Medicine & Rehabilitation

## 2011-05-19 ENCOUNTER — Encounter: Payer: BC Managed Care – PPO | Attending: Physical Medicine & Rehabilitation

## 2011-05-30 ENCOUNTER — Telehealth: Payer: Self-pay | Admitting: Physical Medicine & Rehabilitation

## 2011-05-30 NOTE — Telephone Encounter (Signed)
Verbal order given  

## 2011-05-30 NOTE — Telephone Encounter (Signed)
Requesting to continue OT 2wk/2/

## 2011-06-15 ENCOUNTER — Encounter: Payer: Self-pay | Admitting: Physical Medicine & Rehabilitation

## 2011-08-01 ENCOUNTER — Other Ambulatory Visit (HOSPITAL_COMMUNITY): Payer: Self-pay | Admitting: Internal Medicine

## 2012-03-08 ENCOUNTER — Other Ambulatory Visit: Payer: Self-pay | Admitting: *Deleted

## 2012-03-12 ENCOUNTER — Other Ambulatory Visit: Payer: Self-pay

## 2013-08-10 IMAGING — CT CT HEAD W/O CM
1 of 2 series · 13 of 30 positions shown, 17 images · non-contrast
Comparison: None.

CLINICAL DATA: Right side weakness.

CT HEAD WITHOUT CONTRAST
TECHNIQUE: Contiguous axial images were obtained from the base of
the skull through the vertex without contrast.

[Series 2: brain · axial · 0.47mm/px · z∈[+140,+274]mm · 13 of 32 slices shown, 17 images]
[im 3/32  brain]
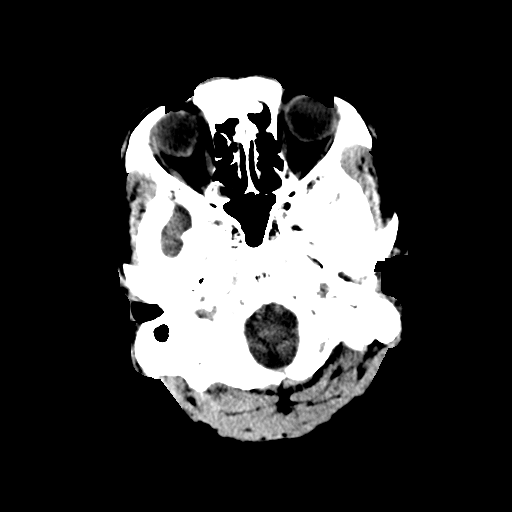
[im 3/32  bone]
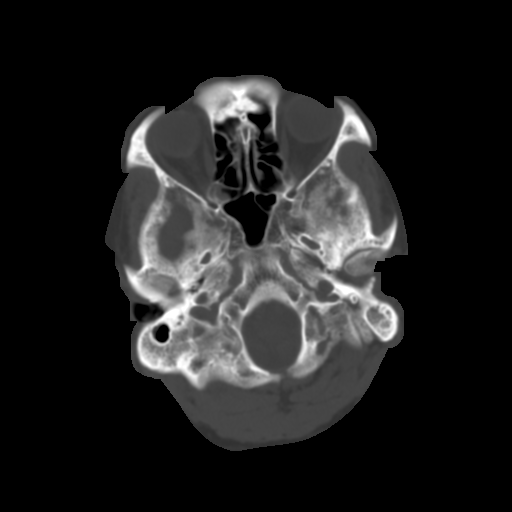
[im 5/32  brain]
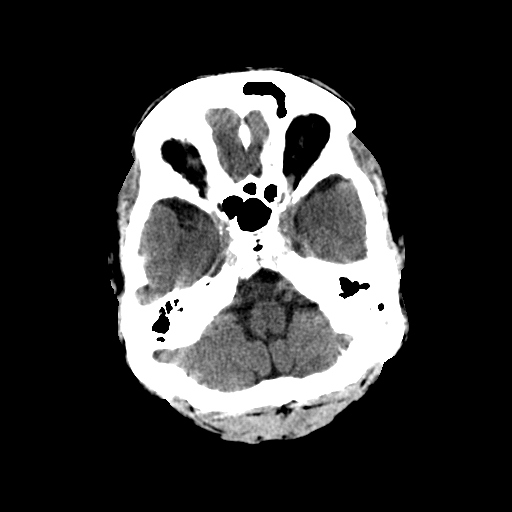
[im 7/32  brain]
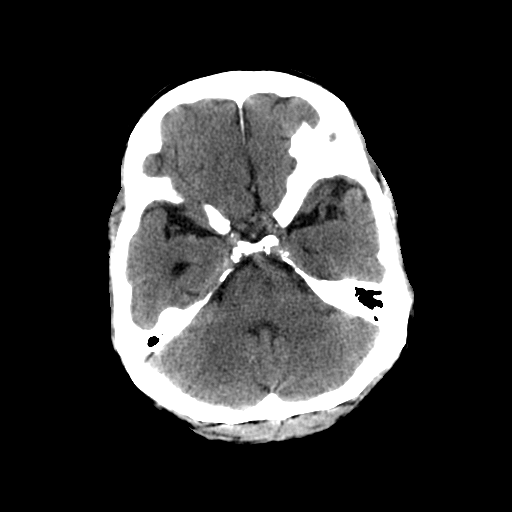
[im 9/32  brain]
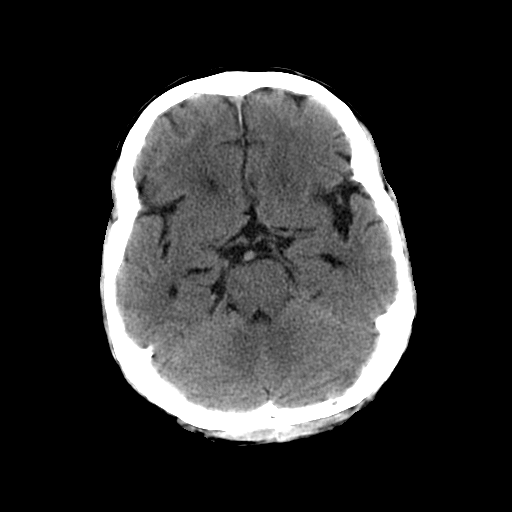
[im 12/32  brain]
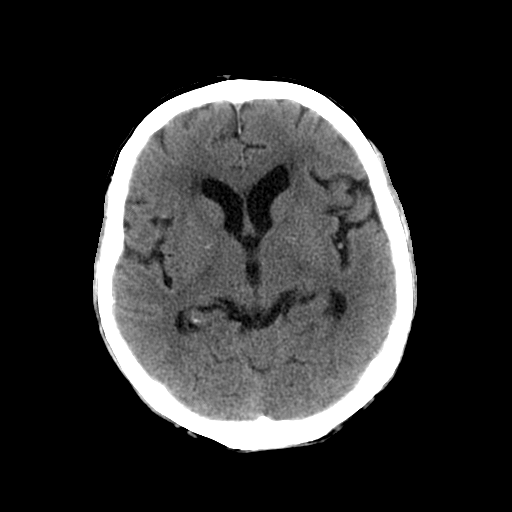
[im 12/32  bone]
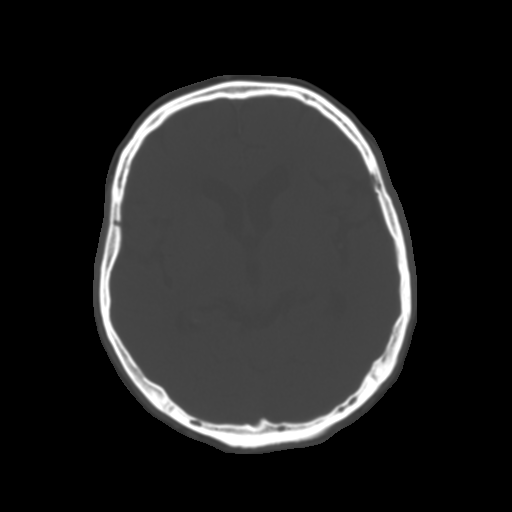
[im 14/32  brain]
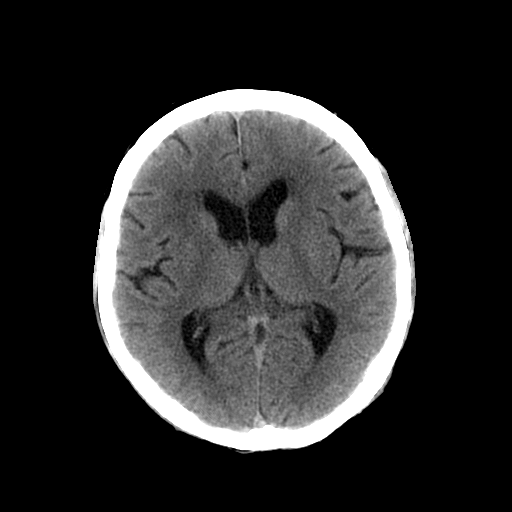
[im 16/32  brain]
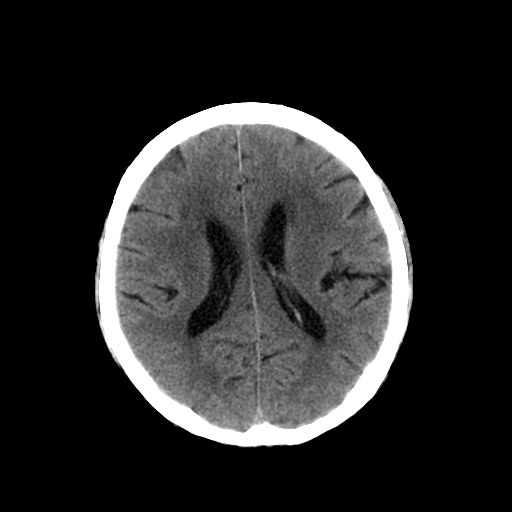
[im 18/32  brain]
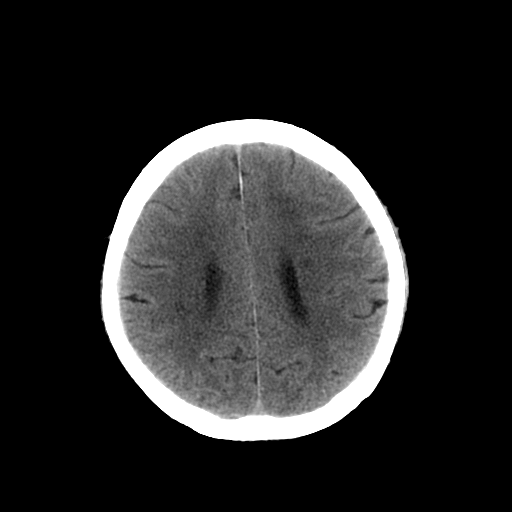
[im 20/32  brain]
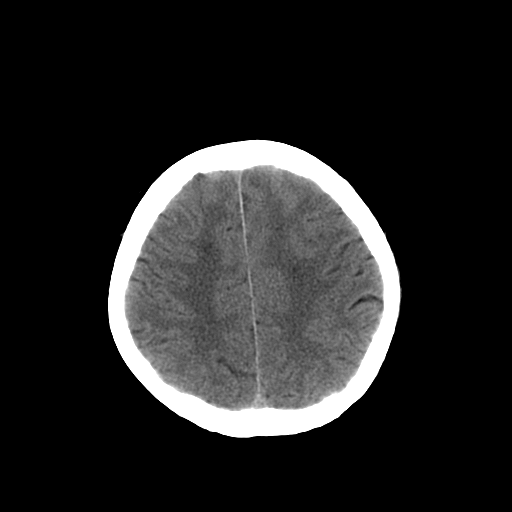
[im 20/32  bone]
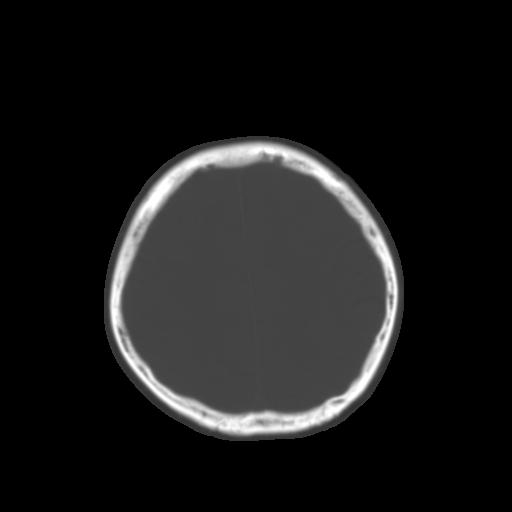
[im 23/32  brain]
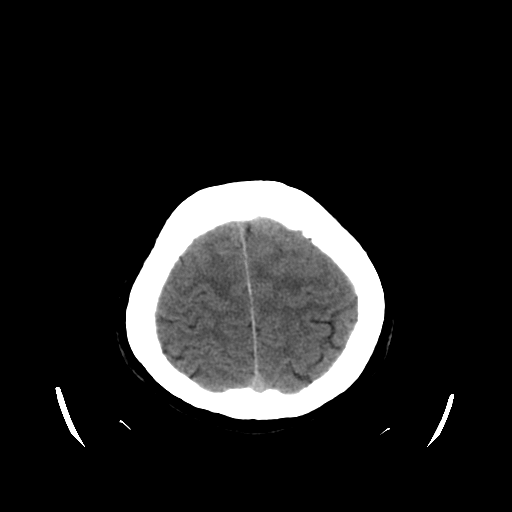
[im 25/32  brain]
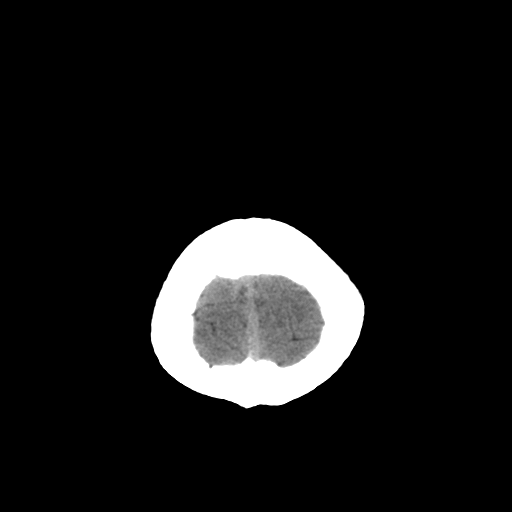
[im 27/32  brain]
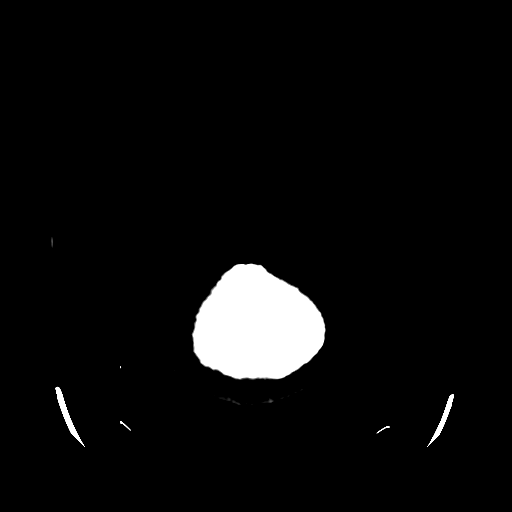
[im 29/32  brain]
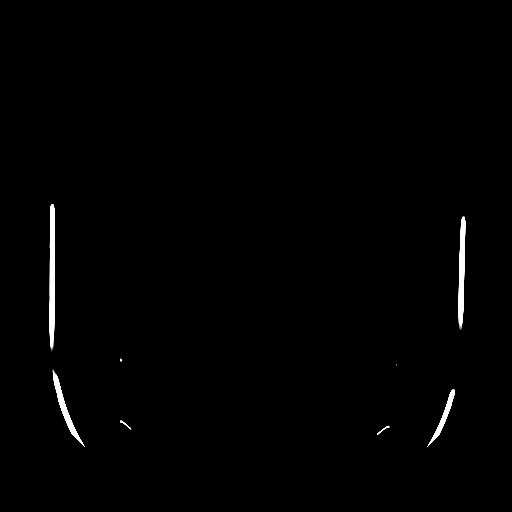
[im 29/32  bone]
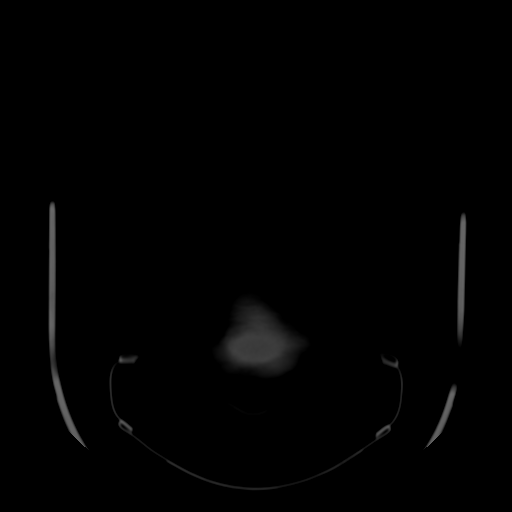

[13 of 30 positions shown; findings below may reference images not displayed]

FINDINGS: There is no evidence of acute intracranial abnormality
including acute infarction, hemorrhage, mass lesion, mass effect,
midline shift or abnormal excessive fluid collection.  No
hydrocephalus or pneumocephalus.  Mild chronic microvascular
ischemic change noted.  Calvarium intact.
IMPRESSION: No acute finding.

## 2022-06-01 ENCOUNTER — Encounter: Payer: Self-pay | Admitting: Oncology

## 2022-06-01 ENCOUNTER — Inpatient Hospital Stay: Payer: 59 | Attending: Oncology | Admitting: Oncology

## 2022-06-01 ENCOUNTER — Inpatient Hospital Stay: Payer: 59

## 2022-06-01 VITALS — BP 142/82 | HR 62 | Resp 14 | Ht 61.0 in | Wt 138.3 lb

## 2022-06-01 DIAGNOSIS — I252 Old myocardial infarction: Secondary | ICD-10-CM | POA: Diagnosis not present

## 2022-06-01 DIAGNOSIS — Z7984 Long term (current) use of oral hypoglycemic drugs: Secondary | ICD-10-CM | POA: Insufficient documentation

## 2022-06-01 DIAGNOSIS — Z7982 Long term (current) use of aspirin: Secondary | ICD-10-CM | POA: Diagnosis not present

## 2022-06-01 DIAGNOSIS — I10 Essential (primary) hypertension: Secondary | ICD-10-CM | POA: Diagnosis not present

## 2022-06-01 DIAGNOSIS — D509 Iron deficiency anemia, unspecified: Secondary | ICD-10-CM | POA: Diagnosis present

## 2022-06-01 DIAGNOSIS — I2510 Atherosclerotic heart disease of native coronary artery without angina pectoris: Secondary | ICD-10-CM | POA: Insufficient documentation

## 2022-06-01 DIAGNOSIS — D582 Other hemoglobinopathies: Secondary | ICD-10-CM

## 2022-06-01 DIAGNOSIS — Z79899 Other long term (current) drug therapy: Secondary | ICD-10-CM | POA: Diagnosis not present

## 2022-06-01 DIAGNOSIS — E119 Type 2 diabetes mellitus without complications: Secondary | ICD-10-CM | POA: Insufficient documentation

## 2022-06-01 DIAGNOSIS — Z8673 Personal history of transient ischemic attack (TIA), and cerebral infarction without residual deficits: Secondary | ICD-10-CM | POA: Insufficient documentation

## 2022-06-01 DIAGNOSIS — D539 Nutritional anemia, unspecified: Secondary | ICD-10-CM

## 2022-06-01 LAB — CBC WITH DIFFERENTIAL (CANCER CENTER ONLY)
Abs Immature Granulocytes: 0.27 10*3/uL — ABNORMAL HIGH (ref 0.00–0.07)
Basophils Absolute: 0 10*3/uL (ref 0.0–0.1)
Basophils Relative: 1 %
Eosinophils Absolute: 0.4 10*3/uL (ref 0.0–0.5)
Eosinophils Relative: 7 %
HCT: 37.8 % — ABNORMAL LOW (ref 39.0–52.0)
Hemoglobin: 11.6 g/dL — ABNORMAL LOW (ref 13.0–17.0)
Immature Granulocytes: 4 %
Lymphocytes Relative: 34 %
Lymphs Abs: 2.1 10*3/uL (ref 0.7–4.0)
MCH: 18.3 pg — ABNORMAL LOW (ref 26.0–34.0)
MCHC: 30.7 g/dL (ref 30.0–36.0)
MCV: 59.7 fL — ABNORMAL LOW (ref 80.0–100.0)
Monocytes Absolute: 1.2 10*3/uL — ABNORMAL HIGH (ref 0.1–1.0)
Monocytes Relative: 19 %
Neutro Abs: 2.2 10*3/uL (ref 1.7–7.7)
Neutrophils Relative %: 35 %
Platelet Count: 172 10*3/uL (ref 150–400)
RBC: 6.33 MIL/uL — ABNORMAL HIGH (ref 4.22–5.81)
RDW: 19.9 % — ABNORMAL HIGH (ref 11.5–15.5)
WBC Count: 6.1 10*3/uL (ref 4.0–10.5)
nRBC: 0 % (ref 0.0–0.2)

## 2022-06-01 LAB — RETICULOCYTES
Immature Retic Fract: 44.4 % — ABNORMAL HIGH (ref 2.3–15.9)
RBC.: 6.31 MIL/uL — ABNORMAL HIGH (ref 4.22–5.81)
Retic Count, Absolute: 106.6 10*3/uL (ref 19.0–186.0)
Retic Ct Pct: 1.7 % (ref 0.4–3.1)

## 2022-06-01 NOTE — Progress Notes (Signed)
[name]:  Patient Care Team: Enid Skeens., MD as PCP - General (Family Medicine)  CHIEF COMPLAINTS/PURPOSE OF CONSULTATION:   HISTORY OF PRESENTING ILLNESS: Edward Choi 72 y.o. male is here because of  abnormal hemoglobin electrophoresis Medical history notable for chronic back pain, coronary artery disease with MI, hypertension, CVA, diabetes mellitus type 2   April 14, 2022:  WBC 6.3 hemoglobin 12.6 RBC 6.89 MCV 61 platelets 154; 34 segs 39 lymphs 19 monos 7 eos 1 basophil.  [place]ace 2.2  May 09, 2022: Folate greater than 24.8 ferritin 249 iron saturation 28%  May 13, 2022: Hemoglobin electrophoresis demonstrated hemoglobin A2 9.0 Hemoglobin F 2.3 hemoglobin E 87.2  Hemoglobin D 1.5  May 19, 2022: WBC 6.1 hemoglobin 11.6 RBC 6.2 MCV 62 platelet count 156; 29 segs 51 lymphs 10 monos 9 eos ANC 1.8  Vitamin B12 greater than 1500 TSH 1.55 Chemistry is notable for creatinine 1.3 hemoglobin A1c 7.8  June 01 2022:  [facility]  Patient had CVA in 2013 attributed to increased cholesterol.   No history of anemia.  He is on a daily ASA 325 mg for management of CAD and vascular disease.  Has never taken oral iron.  Received PRBC's when he had an MI.  He had a colonoscopy when he was in his 13's which was normal.  No hematochezia or melena.    Social:  Married.  Originally from [place].  Worked in Human resources officer.  Tobacco none.  EtOH none  [place] Mother died 75 old age Father died 72 old age Brother 68's well Sisters x 4 one died from stroke, two others from causes unknown to the patient.  One sister alive with HIV in 32's.   Siblings live in [place]  Review of Systems  Constitutional:  Negative for appetite change, chills, fatigue, fever and unexpected weight change.  HENT:   Negative for hearing loss, lump/mass, mouth sores, nosebleeds, sore throat, trouble swallowing and voice change.   Eyes:   Negative for eye problems and icterus.       Vision changes:  None  Respiratory:  Negative for chest tightness, cough, hemoptysis, shortness of breath and wheezing.        PND:  none Orthopnea:  none DOE:    Cardiovascular:  Negative for chest pain, leg swelling and palpitations.       PND:  none Orthopnea:  none  Gastrointestinal:  Negative for abdominal pain, blood in stool, constipation, diarrhea, nausea and vomiting.  Endocrine: Negative for hot flashes.       Cold intolerance:  none Heat intolerance:  none  Genitourinary:  Negative for bladder incontinence, difficulty urinating, dysuria, frequency, hematuria and nocturia.   Musculoskeletal:  Negative for arthralgias, back pain, myalgias, neck pain and neck stiffness.  Skin:  Negative for itching, rash and wound.  Neurological:  Positive for extremity weakness. Negative for headaches, light-headedness, numbness, seizures and speech difficulty.  Hematological:  Negative for adenopathy. Does not bruise/bleed easily.  Psychiatric/Behavioral:  Negative for sleep disturbance and suicidal ideas. The patient is not nervous/anxious.     MEDICAL HISTORY: Past Medical History:  Diagnosis Date   Acute MI ([place])    Back pain    Coronary artery disease    Depression    Diabetes ([place])    Hypertension    Stroke [facility])     SURGICAL HISTORY: Past Surgical History:  Procedure Laterality Date   CORONARY ARTERY BYPASS GRAFT  2006  SOCIAL HISTORY: Social History   Socioeconomic History   Marital status: Married    Spouse name: Not on file   Number of children: Not on file   Years of education: Not on file   Highest education level: Not on file  Occupational History   Not on file  Tobacco Use   Smoking status: Never   Smokeless tobacco: Never  Vaping Use   Vaping Use: Never used  Substance and Sexual Activity   Alcohol use: No   Drug use: No   Sexual activity: Yes  Other Topics Concern   Not on file  Social History  Narrative   Not on file   Social Determinants of Health   Financial Resource Strain: Not on file  Food Insecurity: No Food Insecurity (06/01/2022)   Hunger Vital Sign    Worried About Running Out of Food in the Last Year: Never true    Ran Out of Food in the Last Year: Never true  Transportation Needs: No Transportation Needs (06/01/2022)   PRAPARE - Hydrologist (Medical): No    Lack of Transportation (Non-Medical): No  Physical Activity: Not on file  Stress: Not on file  Social Connections: Not on file  Intimate Partner Violence: Not At Risk (06/01/2022)   Humiliation, Afraid, Rape, and Kick questionnaire    Fear of Current or Ex-Partner: No    Emotionally Abused: No    Physically Abused: No    Sexually Abused: No    FAMILY HISTORY Family History  Problem Relation Age of Onset   Healthy Mother    Healthy Father     ALLERGIES:  has No Known Allergies.  MEDICATIONS:  Current Outpatient Medications  Medication Sig Dispense Refill   amLODipine (NORVASC) 5 MG tablet Take 5 mg by mouth daily.     aspirin EC 325 MG tablet Take 325 mg by mouth daily.     atenolol (TENORMIN) 50 MG tablet Take 50 mg by mouth daily.     cyanocobalamin (VITAMIN B12) 1000 MCG tablet Take 1,000 mcg by mouth daily.     lisinopril (ZESTRIL) 2.5 MG tablet Take 2.5 mg by mouth in the morning and at bedtime.     Magnesium 250 MG TABS Take by mouth.     metFORMIN (GLUCOPHAGE-XR) 500 MG 24 hr tablet Take 500 mg by mouth 2 (two) times daily with a meal.     Multiple Vitamins-Minerals (ONE-A-DAY MENS, MINERALS,) TABS Take by mouth.     rosuvastatin (CRESTOR) 40 MG tablet Take 40 mg by mouth daily.     No current facility-administered medications for this visit.    PHYSICAL EXAMINATION:  ECOG PERFORMANCE STATUS: 0 - Asymptomatic   Vitals:   06/01/22 0906  BP: (!) 142/82  Pulse: 62  Resp: 14  SpO2: 100%    Filed Weights   06/01/22 0906  Weight: 138 lb 4.8 oz (62.7  kg)     Physical Exam Vitals and nursing note reviewed.  Constitutional:      General: He is not in acute distress.    Appearance: Normal appearance. He is normal weight. He is not ill-appearing, toxic-appearing or diaphoretic.  HENT:     Head: Normocephalic and atraumatic.     Right Ear: External ear normal.     Left Ear: External ear normal.     Nose: Nose normal.  Eyes:     General: No scleral icterus.    Conjunctiva/sclera: Conjunctivae normal.     Pupils: Pupils  are equal, round, and reactive to light.  Cardiovascular:     Rate and Rhythm: Normal rate and regular rhythm.     Heart sounds: Normal heart sounds. No murmur heard.    No friction rub. No gallop.  Pulmonary:     Effort: Pulmonary effort is normal. No respiratory distress.     Breath sounds: Normal breath sounds. No stridor. No wheezing, rhonchi or rales.  Abdominal:     Tenderness: There is no abdominal tenderness. There is no guarding or rebound.  Musculoskeletal:        General: No deformity.     Cervical back: Normal range of motion and neck supple. No rigidity or tenderness.     Right lower leg: No edema.     Left lower leg: No edema.  Lymphadenopathy:     Head:     Right side of head: No submental, submandibular, tonsillar, preauricular, posterior auricular or occipital adenopathy.     Left side of head: No submental, submandibular, tonsillar, preauricular, posterior auricular or occipital adenopathy.     Cervical: No cervical adenopathy.     Right cervical: No superficial, deep or posterior cervical adenopathy.    Left cervical: No superficial, deep or posterior cervical adenopathy.     Upper Body:     Right upper body: No supraclavicular or axillary adenopathy.     Left upper body: No supraclavicular or axillary adenopathy.     Lower Body: No right inguinal adenopathy. No left inguinal adenopathy.  Skin:    Coloration: Skin is not jaundiced.     Findings: No rash.  Neurological:     General: No  focal deficit present.     Mental Status: He is alert and oriented to person, place, and time.     Cranial Nerves: No cranial nerve deficit.  Psychiatric:        Mood and Affect: Mood normal.        Behavior: Behavior normal.        Thought Content: Thought content normal.        Judgment: Judgment normal.      LABORATORY DATA: I have personally reviewed the data as listed:  Appointment on 06/01/2022  Component Date Value Ref Range Status   Retic Ct Pct 06/01/2022 1.7  0.4 - 3.1 % Final   RBC. 06/01/2022 6.31 (H)  4.22 - 5.81 MIL/uL Final   Retic Count, Absolute 06/01/2022 106.6  19.0 - 186.0 K/uL Final   Immature Retic Fract 06/01/2022 44.4 (H)  2.3 - 15.9 % Final   Performed at [facility], [place] [address], [place], [place] [address]   WBC Count 06/01/2022 6.1  4.0 - 10.5 K/uL Final   RBC 06/01/2022 6.33 (H)  4.22 - 5.81 MIL/uL Final   Hemoglobin 06/01/2022 11.6 (L)  13.0 - 17.0 g/dL Final   HCT 06/01/2022 37.8 (L)  39.0 - 52.0 % Final   MCV 06/01/2022 59.7 (L)  80.0 - 100.0 fL Final   MCH 06/01/2022 18.3 (L)  26.0 - 34.0 pg Final   MCHC 06/01/2022 30.7  30.0 - 36.0 g/dL Final   RDW 06/01/2022 19.9 (H)  11.5 - 15.5 % Final   Platelet Count 06/01/2022 172  150 - 400 K/uL Final   nRBC 06/01/2022 0.0  0.0 - 0.2 % Final   Neutrophils Relative % 06/01/2022 35  % Final   Neutro Abs 06/01/2022 2.2  1.7 - 7.7 K/uL Final   Lymphocytes Relative 06/01/2022 34  % Final  Lymphs Abs 06/01/2022 2.1  0.7 - 4.0 K/uL Final   Monocytes Relative 06/01/2022 19  % Final   Monocytes Absolute 06/01/2022 1.2 (H)  0.1 - 1.0 K/uL Final   Eosinophils Relative 06/01/2022 7  % Final   Eosinophils Absolute 06/01/2022 0.4  0.0 - 0.5 K/uL Final   Basophils Relative 06/01/2022 1  % Final   Basophils Absolute 06/01/2022 0.0  0.0 - 0.1 K/uL Final   Immature Granulocytes 06/01/2022 4  % Final   Abs Immature Granulocytes 06/01/2022 0.27 (H)  0.00 - 0.07 K/uL Final   Performed at [facility], [place] [address] [place], [place] [address]    RADIOGRAPHIC STUDIES: I have personally reviewed the radiological images as listed and agree with the findings in the report  No results found.  ASSESSMENT/PLAN 72 y.o. male is here because of  abnormal hemoglobin electrophoresis.  Patient is originally from [place].  Medical history notable for chronic back pain, coronary artery disease with MI, hypertension, CVA, diabetes mellitus type 2  Abnormal hemoglobin electrophoresis  May 12 2022- Patient noted to have a microcytic anemia with elevated RBC count.    Hemoglobin electrophoresis notable for Hgb A2 9.0% Hgb F 2.3%  Hemoglobin E 87%  Hgb D 1.5%  Hemoglobin E was the predominant Hgb noted on the Hgb electrophoresis obtained March 2024.    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  With regard to the possibility of a concomitant alpha globin gene abnormality, the patient could have any one of the following and remain asymptomatic: (alpha alpha/ alpha alpha;  -- alpha/alpha alpha; -- --/ alpha, alpha).   Gene testing for alpha and beta chain abnormalities can be used to yield the final diagnosis.     Hemoglobin E:  Abnormal hemoglobin resulting from a point mutation at position 26 of the ? chain resulting  in a substation from glutamic acid to lysine (E26K).  This mutation also activates a cryptic mRNA splice site, which results in reduced synthesis of the ?-E chain and leads to a thalassemia phenotype. Hb E has a weakened ?/? interface, leading to some instability during conditions of increased oxidant stress. Hb E is one of the world's most common and important mutations.  Resistance of Hb AE red cells to invasion by Plasmodium falciparum is most likely the cause for its high prevalence.  Hemoglobin E is found mostly in [place] population; most most commonly in people from [place], [place], [place],  [place], [place], [place], and southern [place]. Hemoglobin E disorders:   Heterogenous group of disorders whose phenotype range from asymptomatic to severe.  Hgb E may be coinherited with other Hgb beta chain disorders (such as Hgb S, beta-thalassemia) and with alpha chain disorders (alpha thalassemia)    Declining Hgb:  Review of the patient's recent labs indicate that Hgb has been slowly declining.  Would recommend consideration be given to further analysis I the form of endoscopy.       Cancer Staging  No matching staging information was found for the patient.   No problem-specific Assessment & Plan notes found for this encounter.    Orders Placed This Encounter  Procedures   CBC with Differential ([facility] Only)    Standing Status:   Future    Number of Occurrences:   1    Standing Expiration Date:   06/01/2023   Reticulocytes    Standing Status:   Future  Number of Occurrences:   1    Standing Expiration Date:   06/01/2023    60  minutes was spent in patient care.  This included time spent preparing to see the patient (e.g., review of tests), obtaining and/or reviewing separately obtained history, counseling and educating the patient/family/caregiver , documenting clinical information in the electronic or other health record, independently interpreting results and communicating results to the patient/family/caregiver as well as coordination of care.       All questions were answered. The patient knows to call the clinic with any problems, questions or concerns.  This note was electronically signed.    Barbee Cough, MD  06/06/2022 2:38 PM

## 2022-06-06 DIAGNOSIS — D582 Other hemoglobinopathies: Secondary | ICD-10-CM | POA: Insufficient documentation

## 2022-06-06 DIAGNOSIS — D539 Nutritional anemia, unspecified: Secondary | ICD-10-CM | POA: Insufficient documentation

## 2022-06-07 ENCOUNTER — Telehealth: Payer: Self-pay | Admitting: Oncology

## 2022-06-07 NOTE — Telephone Encounter (Signed)
Contacted pt to schedule an appt. Unable to reach via phone, voicemail was left.   Message Received: Zonia Kief, CMA sent to [name]; [name]      Previous Messages    ----- Message ----- From: Barbee Cough, MD Sent: 06/06/2022   3:11 PM EDT To: Aleatha Borer, CMA  Please arrange for a follow up visit in the next several weeks Thank you

## 2022-06-08 ENCOUNTER — Telehealth: Payer: Self-pay | Admitting: Oncology

## 2022-06-08 NOTE — Telephone Encounter (Signed)
Contacted pt to schedule an appt. Unable to reach via phone, voicemail was left.    Message Received: 2 days ago Edward Choi, [name] sent to [name]; [name]      Previous Messages    ----- Message ----- From: Barbee Cough, MD Sent: 06/06/2022   3:11 PM EDT To: Edward Choi, CMA  Please arrange for a follow up visit in the next several weeks Thank you

## 2022-06-12 ENCOUNTER — Telehealth: Payer: Self-pay | Admitting: Oncology

## 2022-06-12 NOTE — Telephone Encounter (Signed)
Patient has been scheduled. Aware of appt date and time     Message Received: 6 days ago Aleatha Borer, [place] sent to [name]; [name]      Previous Messages    ----- Message ----- From: Barbee Cough, MD Sent: 06/06/2022   3:11 PM EDT To: Aleatha Borer, CMA  Please arrange for a follow up visit in the next several weeks Thank you

## 2022-06-13 ENCOUNTER — Telehealth: Payer: Self-pay

## 2022-06-13 NOTE — Telephone Encounter (Signed)
Records have been sent to Dr. Jari Sportsman to contact patient.

## 2022-06-13 NOTE — Telephone Encounter (Signed)
-----   Message from Aleatha Borer, [place] sent at 06/07/2022  1:35 PM EDT ----- [facility]  ----- Message ----- From: Barbee Cough, MD Sent: 06/01/2022   1:13 PM EDT To: Aleatha Borer, CMA  Please let patient know that I would like to send him to GI because of the lower Hgb.  Please make that referral and I would like to see him back in 1 month Thanks

## 2022-06-29 ENCOUNTER — Ambulatory Visit: Payer: 59 | Admitting: Oncology

## 2023-02-28 LAB — LAB REPORT - SCANNED
A1c: 7.2
EGFR: 57.3

## 2023-06-21 ENCOUNTER — Telehealth: Payer: Self-pay | Admitting: Hematology and Oncology

## 2023-06-21 NOTE — Telephone Encounter (Signed)
 06/21/23 Spoke with patients wife and [facility] appt scheduled.

## 2023-07-04 ENCOUNTER — Other Ambulatory Visit: Payer: Self-pay | Admitting: Hematology and Oncology

## 2023-07-04 DIAGNOSIS — D582 Other hemoglobinopathies: Secondary | ICD-10-CM

## 2023-07-04 DIAGNOSIS — D539 Nutritional anemia, unspecified: Secondary | ICD-10-CM

## 2023-07-04 NOTE — Progress Notes (Unsigned)
 [facility]  [address] [place],  [place]  [address] [phone]  Clinic Day:  07/05/2023  Referring physician: Lucius Sabins., MD   HISTORY OF PRESENT ILLNESS:  The patient is a 73 y.o. 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  He developed anemia last year, so was seen by Dr. Veneta Gilding.  Due to the decrease in his hemoglobin compared to 2013, GI evaluation was recommended with follow-up after that.  He was referred to Dr. Monico Anna, but did not go. He also did not keep his follow up appointment with us  last year.  He was seen on March 17 by Penne Bowl, NP.  CBC revealed a hemoglobin of 11.6 with an MCV 62.  WBCs 4.52, 42% neutrophils, 42% lymphocytes, 14% monocytes and 2% eosinophils.  Platelets 118,000.  CMP was unremarkable except for glucose 158 and BUN 22.  B12 was greater than 2000.  Hemoglobin A1c was 8.1.  Discontinuing oral B12 was recommended.  He was referred back to our office due to the persistent anemia.  He denies progressive fatigue concerning for worsening anemia.  He denies any overt form of blood loss.  He continues B12 1000 mcg daily.  He has had a previous colonoscopy, but he and his wife are not sure when it was last done.  He does not wish to undergo routine colonoscopy again.  He has lower back pain attributed to arthritis, but continues to work in his garden.    PHYSICAL EXAM:  Blood pressure 108/63, pulse 92, temperature 97.8 F (36.6 C), temperature source Oral, height 5' 1.6" (1.565 m), weight 129 lb (58.5 kg), SpO2 98%. Wt Readings from Last 3 Encounters:  07/05/23 129 lb (58.5 kg)  06/01/22 138 lb 4.8 oz (62.7 kg)  04/26/11 123 lb 14.4 oz (56.2 kg)   Body mass index is 23.9 kg/m.  Performance status (ECOG): 1 - Symptomatic but completely ambulatory  Physical Exam Vitals and nursing note reviewed.  Constitutional:      General: He is not in acute distress.    Appearance: Normal appearance. He is normal weight.   HENT:     Head: Normocephalic and atraumatic.     Mouth/Throat:     Mouth: Mucous membranes are dry.     Pharynx: Oropharynx is clear. No oropharyngeal exudate or posterior oropharyngeal erythema.  Eyes:     General: No scleral icterus.    Extraocular Movements: Extraocular movements intact.     Conjunctiva/sclera: Conjunctivae normal.     Pupils: Pupils are equal, round, and reactive to light.  Cardiovascular:     Rate and Rhythm: Normal rate and regular rhythm.     Heart sounds: Normal heart sounds. No murmur heard.    No friction rub. No gallop.  Pulmonary:     Effort: Pulmonary effort is normal.     Breath sounds: Normal breath sounds. No wheezing, rhonchi or rales.  Abdominal:     General: Bowel sounds are normal. There is no distension.     Palpations: Abdomen is soft. There is no hepatomegaly, splenomegaly or mass.     Tenderness: There is no abdominal tenderness.  Musculoskeletal:        General: Normal range of motion.     Cervical back: Normal range of motion and neck supple. No tenderness.     Right lower leg: No edema.     Left lower leg: No edema.  Lymphadenopathy:     Cervical: No cervical adenopathy.  Upper Body:     Right upper body: No supraclavicular or axillary adenopathy.     Left upper body: No supraclavicular or axillary adenopathy.     Lower Body: No right inguinal adenopathy. No left inguinal adenopathy.  Skin:    General: Skin is warm and dry.     Coloration: Skin is not jaundiced.     Findings: No rash.  Neurological:     Mental Status: He is alert and oriented to person, place, and time.     Cranial Nerves: No cranial nerve deficit.  Psychiatric:        Mood and Affect: Mood normal.        Behavior: Behavior normal.        Thought Content: Thought content normal.     LABS:      Latest Ref Rng & Units 07/05/2023    9:09 AM 06/01/2022    9:48 AM 04/23/2011    9:36 AM  CBC  WBC 4.0 - 10.5 K/uL 4.9  6.1  6.1   Hemoglobin 13.0 - 17.0 g/dL  78.4  69.6  29.5   Hematocrit 39.0 - 52.0 % 35.1  37.8  41.0   Platelets 150 - 400 K/uL 132  172  233     Latest Reference Range & Units 07/05/23 09:09  WBC 4.0 - 10.5 K/uL 4.9  RBC 4.22 - 5.81 MIL/uL 5.89 (H)  Hemoglobin 13.0 - 17.0 g/dL 28.4 (L)  HCT 13.2 - 44.0 % 35.1 (L)  MCV 80.0 - 100.0 fL 59.6 (L)  MCH 26.0 - 34.0 pg 19.7 (L)  MCHC 30.0 - 36.0 g/dL 10.2  RDW 72.5 - 36.6 % 19.0 (H)  Platelets 150 - 400 K/uL 132 (L)  nRBC 0.0 - 0.2 % 0 /100 WBC 0.00 0  Neutrophils % 44  Lymphocytes % 38  Monocytes Relative % 17  Eosinophil % 1  Basophil % 0  Immature Granulocytes % 0  NEUT# 1.7 - 7.7 K/uL 2.1  Lymphs Abs 0.7 - 4.0 K/uL 1.9  Monocyte # 0.1 - 1.0 K/uL 0.8  Eosinophils Absolute 0.0 - 0.5 K/uL 0.1  Basophils Absolute 0.0 - 0.1 K/uL 0.0  Abs Immature Granulocytes 0.00 - 0.07 K/uL 0.00  Target Cells  TARGET CELLS  WBC Morphology  MORPHOLOGY UNREMARKABLE  Smear Review  PLATELET COUNT CONFIRMED BY SMEAR  (H): Data is abnormally high (L): Data is abnormally low      Latest Ref Rng & Units 04/07/2011    5:45 AM 04/03/2011    7:52 PM 04/03/2011   12:00 AM  CMP  Glucose 70 - 99 mg/dL 440  347    BUN 6 - 23 mg/dL 12  16    Creatinine 4.25 - 1.35 mg/dL 9.56  3.87  5.64   Sodium 135 - 145 mEq/L 139  138    Potassium 3.5 - 5.1 mEq/L 4.2  3.6    Chloride 96 - 112 mEq/L 105  101    CO2 19 - 32 mEq/L 23  23    Calcium  8.4 - 10.5 mg/dL 8.8  9.2    Total Protein 6.0 - 8.3 g/dL 7.4  8.2    Total Bilirubin 0.3 - 1.2 mg/dL 0.5  0.6    Alkaline Phos 39 - 117 U/L 81  83    AST 0 - 37 U/L 33  22    ALT 0 - 53 U/L 30  17       No results found for: "CEA1", "CEA" / No  results found for: "CEA1", "CEA" No results found for: "PSA1" No results found for: "NWG956" No results found for: "CAN125"  No results found for: "TOTALPROTELP", "ALBUMINELP", "A1GS", "A2GS", "BETS", "BETA2SER", "GAMS", "MSPIKE", "SPEI" No results found for: "TIBC", "FERRITIN", "IRONPCTSAT" No results found for:  "LDH"  No results found for: "AFPTUMOR", "TOTALPROTELP", "ALBUMINELP", "A1GS", "A2GS", "BETS", "BETA2SER", "GAMS", "MSPIKE", "SPEI", "LDH", "CEA1", "CEA", "PSA1", "IGASERUM", "IGGSERUM", "IGMSERUM", "THGAB", "THYROGLB"  Review Flowsheet        No data to display           STUDIES:  No results found.    ASSESSMENT & PLAN:   Assessment/Plan:  73 y.o. male with microcytic anemia.  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  His hemoglobin is stable compared to last year.  He has mild thrombocytopenia, which is improved from March.  There is nothing concerning per his peripheral smear today.  Iron studies are pending. If he is found to have iron deficiency, we would recommend GI evaluation.  I will plan to see him back in 6 months for repeat clinical assessment.  The patient understands all the plans discussed today and is in agreement with them.  He knows to contact our office if he develops concerns prior to his next appointment.     Alfonso Ike, PA-C   Physician Assistant [facility] [phone]

## 2023-07-05 ENCOUNTER — Encounter: Payer: Self-pay | Admitting: Hematology and Oncology

## 2023-07-05 ENCOUNTER — Inpatient Hospital Stay: Attending: Hematology and Oncology | Admitting: Hematology and Oncology

## 2023-07-05 ENCOUNTER — Telehealth: Payer: Self-pay | Admitting: Hematology and Oncology

## 2023-07-05 ENCOUNTER — Encounter: Payer: Self-pay | Admitting: Dietician

## 2023-07-05 ENCOUNTER — Inpatient Hospital Stay

## 2023-07-05 VITALS — BP 108/63 | HR 92 | Temp 97.8°F | Ht 61.6 in | Wt 129.0 lb

## 2023-07-05 DIAGNOSIS — D509 Iron deficiency anemia, unspecified: Secondary | ICD-10-CM | POA: Diagnosis present

## 2023-07-05 DIAGNOSIS — D582 Other hemoglobinopathies: Secondary | ICD-10-CM

## 2023-07-05 DIAGNOSIS — D696 Thrombocytopenia, unspecified: Secondary | ICD-10-CM | POA: Insufficient documentation

## 2023-07-05 DIAGNOSIS — D539 Nutritional anemia, unspecified: Secondary | ICD-10-CM

## 2023-07-05 LAB — FOLATE: Folate: >40 ng/mL (ref >5.9)

## 2023-07-05 LAB — CBC WITH DIFFERENTIAL (CANCER CENTER ONLY)
Abs Immature Granulocytes: 0 10*3/uL (ref 0.00–0.07)
Basophils Absolute: 0 10*3/uL (ref 0.0–0.1)
Basophils Relative: 0 %
Eosinophils Absolute: 0.1 10*3/uL (ref 0.0–0.5)
Eosinophils Relative: 1 %
HCT: 35.1 % — ABNORMAL LOW (ref 39.0–52.0)
Hemoglobin: 11.6 g/dL — ABNORMAL LOW (ref 13.0–17.0)
Immature Granulocytes: 0 %
Lymphocytes Relative: 38 %
Lymphs Abs: 1.9 10*3/uL (ref 0.7–4.0)
MCH: 19.7 pg — ABNORMAL LOW (ref 26.0–34.0)
MCHC: 33 g/dL (ref 30.0–36.0)
MCV: 59.6 fL — ABNORMAL LOW (ref 80.0–100.0)
Monocytes Absolute: 0.8 10*3/uL (ref 0.1–1.0)
Monocytes Relative: 17 %
Neutro Abs: 2.1 10*3/uL (ref 1.7–7.7)
Neutrophils Relative %: 44 %
Platelet Count: 132 10*3/uL — ABNORMAL LOW (ref 150–400)
RBC: 5.89 MIL/uL — ABNORMAL HIGH (ref 4.22–5.81)
RDW: 19 % — ABNORMAL HIGH (ref 11.5–15.5)
WBC Count: 4.9 10*3/uL (ref 4.0–10.5)
nRBC: 0 % (ref 0.0–0.2)
nRBC: 0 /100{WBCs}

## 2023-07-05 LAB — RETICULOCYTES
Immature Retic Fract: 32.8 % — ABNORMAL HIGH (ref 2.3–15.9)
RBC.: 5.93 MIL/uL — ABNORMAL HIGH (ref 4.22–5.81)
Retic Count, Absolute: 56.3 10*3/uL (ref 19.0–186.0)
Retic Ct Pct: 1 % (ref 0.4–3.1)

## 2023-07-05 LAB — IRON AND TIBC
Iron: 86 ug/dL (ref 45–182)
Saturation Ratios: 26 % (ref 17.9–39.5)
TIBC: 328 ug/dL (ref 250–450)
UIBC: 242 ug/dL

## 2023-07-05 LAB — VITAMIN B12: Vitamin B-12: 2029 pg/mL — ABNORMAL HIGH (ref 180–914)

## 2023-07-05 LAB — FERRITIN: Ferritin: 125 ng/mL (ref 24–336)

## 2023-07-05 NOTE — Telephone Encounter (Signed)
 Patient has been scheduled for follow-up visit per 07/05/23 LOS.  Pt given an appt calendar with date and time.

## 2023-07-06 LAB — SOLUBLE TRANSFERRIN RECEPTOR: Transferrin Receptor: 52.5 nmol/L — ABNORMAL HIGH (ref 12.2–27.3)

## 2023-07-09 LAB — PROTEIN ELECTROPHORESIS, SERUM, WITH REFLEX
A/G Ratio: 1.2 (ref 0.7–1.7)
Albumin ELP: 4.2 g/dL (ref 2.9–4.4)
Alpha-1-Globulin: 0.1 g/dL (ref 0.0–0.4)
Alpha-2-Globulin: 0.7 g/dL (ref 0.4–1.0)
Beta Globulin: 1 g/dL (ref 0.7–1.3)
Gamma Globulin: 1.7 g/dL (ref 0.4–1.8)
Globulin, Total: 3.5 g/dL (ref 2.2–3.9)
Total Protein ELP: 7.7 g/dL (ref 6.0–8.5)

## 2023-07-27 ENCOUNTER — Other Ambulatory Visit: Payer: Self-pay | Admitting: Hematology and Oncology

## 2023-07-27 ENCOUNTER — Telehealth: Payer: Self-pay | Admitting: Hematology and Oncology

## 2023-07-27 DIAGNOSIS — D582 Other hemoglobinopathies: Secondary | ICD-10-CM

## 2023-07-27 NOTE — Telephone Encounter (Signed)
 Patient has been scheduled for follow-up visit per 07/26/23 LOS.  LVM notifying pt of appt details, provided my direct number to pt if appt changes need to be made.

## 2023-09-05 LAB — LAB REPORT - SCANNED
A1c: 6.9
Albumin, Urine POC: 8.7
Creatinine, POC: 205.8 mg/dL
EGFR: 71.1
Microalb Creat Ratio: 42.3
TSH: 0.78 (ref 0.41–5.90)

## 2023-11-06 ENCOUNTER — Inpatient Hospital Stay

## 2023-11-06 ENCOUNTER — Inpatient Hospital Stay: Admitting: Hematology and Oncology

## 2023-11-22 ENCOUNTER — Other Ambulatory Visit: Payer: Self-pay

## 2023-11-22 ENCOUNTER — Inpatient Hospital Stay (HOSPITAL_BASED_OUTPATIENT_CLINIC_OR_DEPARTMENT_OTHER): Admitting: Hematology and Oncology

## 2023-11-22 ENCOUNTER — Inpatient Hospital Stay: Attending: Hematology and Oncology

## 2023-11-22 VITALS — BP 115/68 | HR 71 | Temp 98.0°F | Resp 16 | Ht 61.6 in | Wt 126.6 lb

## 2023-11-22 DIAGNOSIS — D696 Thrombocytopenia, unspecified: Secondary | ICD-10-CM | POA: Insufficient documentation

## 2023-11-22 DIAGNOSIS — D582 Other hemoglobinopathies: Secondary | ICD-10-CM | POA: Diagnosis not present

## 2023-11-22 DIAGNOSIS — D509 Iron deficiency anemia, unspecified: Secondary | ICD-10-CM | POA: Diagnosis present

## 2023-11-22 LAB — CMP (CANCER CENTER ONLY)
ALT: 21 U/L (ref 0–44)
AST: 32 U/L (ref 15–41)
Albumin: 4 g/dL (ref 3.5–5.0)
Alkaline Phosphatase: 59 U/L (ref 38–126)
Anion gap: 10 (ref 5–15)
BUN: 15 mg/dL (ref 8–23)
CO2: 24 mmol/L (ref 22–32)
Calcium: 8.7 mg/dL — ABNORMAL LOW (ref 8.9–10.3)
Chloride: 108 mmol/L (ref 98–111)
Creatinine: 1.17 mg/dL (ref 0.61–1.24)
GFR, Estimated: >60 mL/min (ref >60)
Glucose, Bld: 162 mg/dL — ABNORMAL HIGH (ref 70–99)
Potassium: 4.6 mmol/L (ref 3.5–5.1)
Sodium: 142 mmol/L (ref 135–145)
Total Bilirubin: 0.5 mg/dL (ref 0.0–1.2)
Total Protein: 7.4 g/dL (ref 6.5–8.1)

## 2023-11-22 LAB — CBC WITH DIFFERENTIAL (CANCER CENTER ONLY)
Abs Immature Granulocytes: 0.01 K/uL (ref 0.00–0.07)
Basophils Absolute: 0 K/uL (ref 0.0–0.1)
Basophils Relative: 0 %
Eosinophils Absolute: 0 K/uL (ref 0.0–0.5)
Eosinophils Relative: 0 %
HCT: 32.3 % — ABNORMAL LOW (ref 39.0–52.0)
Hemoglobin: 10.4 g/dL — ABNORMAL LOW (ref 13.0–17.0)
Immature Granulocytes: 0 %
Lymphocytes Relative: 45 %
Lymphs Abs: 2.2 K/uL (ref 0.7–4.0)
MCH: 20 pg — ABNORMAL LOW (ref 26.0–34.0)
MCHC: 32.2 g/dL (ref 30.0–36.0)
MCV: 62 fL — ABNORMAL LOW (ref 80.0–100.0)
Monocytes Absolute: 0.9 K/uL (ref 0.1–1.0)
Monocytes Relative: 18 %
Neutro Abs: 1.9 K/uL (ref 1.7–7.7)
Neutrophils Relative %: 37 %
Platelet Count: 127 K/uL — ABNORMAL LOW (ref 150–400)
RBC: 5.21 MIL/uL (ref 4.22–5.81)
RDW: 18.7 % — ABNORMAL HIGH (ref 11.5–15.5)
WBC Count: 5 K/uL (ref 4.0–10.5)
nRBC: 0 % (ref 0.0–0.2)

## 2023-11-22 LAB — IRON AND TIBC
Iron: 94 ug/dL (ref 45–182)
Saturation Ratios: 33 % (ref 17.9–39.5)
TIBC: 286 ug/dL (ref 250–450)
UIBC: 191 ug/dL

## 2023-11-22 LAB — FERRITIN: Ferritin: 69 ng/mL (ref 24–336)

## 2023-11-22 NOTE — Progress Notes (Signed)
 [facility]  [address] [place],  [place]  [address] [phone]  Clinic Day:  11/22/2023   Referring physician: Duwaine Burnard Amble, NP   HISTORY OF PRESENT ILLNESS:  The patient is a 73 y.o. 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  He developed anemia last year, so was seen by Dr. Bernie.  Due to the decrease in his hemoglobin compared to 2013, GI evaluation was recommended with follow-up after that.  He was referred to Dr. Larene, but did not go. He also did not keep his follow up appointment with us  last year.    He is referred back to our office by Burnard Duwaine, NP due to the persistent anemia.  He was seen for routine follow-up on March 17.  CBC revealed a hemoglobin of 11.6 with an MCV 62.  WBCs 4.52, 42% neutrophils, 42% lymphocytes, 14% monocytes and 2% eosinophils.  Platelets 118,000.  CMP was unremarkable except for glucose 158 and BUN 22.  B12 was greater than 2000.  Hemoglobin A1c was 8.1.  Discontinuing oral B12 was recommended.  He denies progressive fatigue concerning for worsening anemia.  He denies any overt form of blood loss.  He continues B12 1000 mcg daily.  He has had a previous colonoscopy, but he and his wife are not sure when it was last done.  He does not wish to undergo routine colonoscopy again.  He has lower back pain attributed to arthritis, but continues to work in his garden.    PHYSICAL EXAM:  Blood pressure 115/68, pulse 71, temperature 98 F (36.7 C), temperature source Oral, resp. rate 16, height 5' 1.6 (1.565 m), weight 126 lb 9.6 oz (57.4 kg), SpO2 98%. Wt Readings from Last 3 Encounters:  11/22/23 126 lb 9.6 oz (57.4 kg)  07/05/23 129 lb (58.5 kg)  06/01/22 138 lb 4.8 oz (62.7 kg)   Body mass index is 23.46 kg/m.  Performance status (ECOG): 1 - Symptomatic but completely ambulatory  Physical Exam Vitals and nursing note reviewed.  Constitutional:      General: He is not in acute distress.     Appearance: Normal appearance. He is normal weight.  HENT:     Head: Normocephalic and atraumatic.     Mouth/Throat:     Mouth: Mucous membranes are dry.     Pharynx: Oropharynx is clear. No oropharyngeal exudate or posterior oropharyngeal erythema.  Eyes:     General: No scleral icterus.    Extraocular Movements: Extraocular movements intact.     Conjunctiva/sclera: Conjunctivae normal.     Pupils: Pupils are equal, round, and reactive to light.  Cardiovascular:     Rate and Rhythm: Normal rate and regular rhythm.     Heart sounds: Normal heart sounds. No murmur heard.    No friction rub. No gallop.  Pulmonary:     Effort: Pulmonary effort is normal.     Breath sounds: Normal breath sounds. No wheezing, rhonchi or rales.  Abdominal:     General: Bowel sounds are normal. There is no distension.     Palpations: Abdomen is soft. There is no hepatomegaly, splenomegaly or mass.     Tenderness: There is no abdominal tenderness.  Musculoskeletal:        General: Normal range of motion.     Cervical back: Normal range of motion and neck supple. No tenderness.     Right lower leg: No edema.     Left lower leg: No edema.  Lymphadenopathy:     Cervical: No cervical adenopathy.     Upper Body:     Right upper body: No supraclavicular or axillary adenopathy.     Left upper body: No supraclavicular or axillary adenopathy.     Lower Body: No right inguinal adenopathy. No left inguinal adenopathy.  Skin:    General: Skin is warm and dry.     Coloration: Skin is not jaundiced.     Findings: No rash.  Neurological:     Mental Status: He is alert and oriented to person, place, and time.     Cranial Nerves: No cranial nerve deficit.  Psychiatric:        Mood and Affect: Mood normal.        Behavior: Behavior normal.        Thought Content: Thought content normal.     LABS:      Latest Ref Rng & Units 11/22/2023   10:33 AM 07/05/2023    9:09 AM 06/01/2022    9:48 AM  CBC  WBC 4.0 -  10.5 K/uL 5.0  4.9  6.1   Hemoglobin 13.0 - 17.0 g/dL 89.5  88.3  88.3   Hematocrit 39.0 - 52.0 % 32.3  35.1  37.8   Platelets 150 - 400 K/uL 127  132  172     Latest Reference Range & Units 07/05/23 09:09  WBC 4.0 - 10.5 K/uL 4.9  RBC 4.22 - 5.81 MIL/uL 5.89 (H)  Hemoglobin 13.0 - 17.0 g/dL 88.3 (L)  HCT 60.9 - 47.9 % 35.1 (L)  MCV 80.0 - 100.0 fL 59.6 (L)  MCH 26.0 - 34.0 pg 19.7 (L)  MCHC 30.0 - 36.0 g/dL 66.9  RDW 88.4 - 84.4 % 19.0 (H)  Platelets 150 - 400 K/uL 132 (L)  nRBC 0.0 - 0.2 % 0 /100 WBC 0.00 0  Neutrophils % 44  Lymphocytes % 38  Monocytes Relative % 17  Eosinophil % 1  Basophil % 0  Immature Granulocytes % 0  NEUT# 1.7 - 7.7 K/uL 2.1  Lymphs Abs 0.7 - 4.0 K/uL 1.9  Monocyte # 0.1 - 1.0 K/uL 0.8  Eosinophils Absolute 0.0 - 0.5 K/uL 0.1  Basophils Absolute 0.0 - 0.1 K/uL 0.0  Abs Immature Granulocytes 0.00 - 0.07 K/uL 0.00  Target Cells  TARGET CELLS  WBC Morphology  MORPHOLOGY UNREMARKABLE  Smear Review  PLATELET COUNT CONFIRMED BY SMEAR  (H): Data is abnormally high (L): Data is abnormally low      Latest Ref Rng & Units 11/22/2023   10:33 AM 04/07/2011    5:45 AM 04/03/2011    7:52 PM  CMP  Glucose 70 - 99 mg/dL 837  891  832   BUN 8 - 23 mg/dL 15  12  16    Creatinine 0.61 - 1.24 mg/dL 8.82  9.06  9.07   Sodium 135 - 145 mmol/L 142  139  138   Potassium 3.5 - 5.1 mmol/L 4.6  4.2  3.6   Chloride 98 - 111 mmol/L 108  105  101   CO2 22 - 32 mmol/L 24  23  23    Calcium  8.9 - 10.3 mg/dL 8.7  8.8  9.2   Total Protein 6.5 - 8.1 g/dL 7.4  7.4  8.2   Total Bilirubin 0.0 - 1.2 mg/dL 0.5  0.5  0.6   Alkaline Phos 38 - 126 U/L 59  81  83   AST 15 - 41 U/L 32  33  22  ALT 0 - 44 U/L 21  30  17       Lab Results  Component Value Date   TIBC 286 11/22/2023   TIBC 328 07/05/2023   FERRITIN 69 11/22/2023   FERRITIN 125 07/05/2023   IRONPCTSAT 33 11/22/2023   IRONPCTSAT 26 07/05/2023    Review Flowsheet       Latest Ref Rng & Units 07/05/2023  11/22/2023  Oncology Labs  Ferritin 24 - 336 ng/mL 125  69   %SAT 17.9 - 39.5 % 26  33   Total Protein ELP 6.0 - 8.5 g/dL 7.7  -  Albumin ELP 2.9 - 4.4 g/dL 4.2  -  Alpha-1 Globulin 0.0 - 0.4 g/dL 0.1  -  Alpha-2 Globulin 0.4 - 1.0 g/dL 0.7  -  Beta Globulin 0.7 - 1.3 g/dL 1.0  -  Gamma Globulin 0.4 - 1.8 g/dL 1.7  -  M-Spike, % Not Observed g/dL Not Observed  -     STUDIES:  No results found.    ASSESSMENT & PLAN:   Assessment/Plan:  73 y.o. male with microcytic anemia.  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  His hemoglobin is decreased compared to last year.  He does not have evidence of iron deficiency.  He has mild thrombocytopenia, which is improved from March.  His anemia and thrombocytopenia could be due to liver disease.  He may have a early myelodysplasia.  There is nothing concerning per his peripheral smear today.  He understands screening for colon cancer is recommended until age 64.  As he has no family history of colon cancer, he could consider Cologuard.  I will plan to see him back in 6 months for repeat clinical assessment.  The patient understands all the plans discussed today and is in agreement with them.  He knows to contact our office if he develops concerns prior to his next appointment.     Eleanor DELENA Bach, NP   Family Nurse Practitioner - Board Certified [facility] [place] [phone]

## 2023-11-23 LAB — SOLUBLE TRANSFERRIN RECEPTOR: Transferrin Receptor: 40.6 nmol/L — ABNORMAL HIGH (ref 12.2–27.3)

## 2023-12-13 LAB — LAB REPORT - SCANNED
A1c: 6.4
EGFR: 74.2

## 2024-01-04 ENCOUNTER — Other Ambulatory Visit

## 2024-01-04 ENCOUNTER — Ambulatory Visit: Admitting: Hematology and Oncology

## 2024-05-21 ENCOUNTER — Ambulatory Visit: Admitting: Hematology and Oncology

## 2024-05-21 ENCOUNTER — Other Ambulatory Visit
